# Patient Record
Sex: Female | Born: 1960 | Hispanic: Yes | Smoking: Never smoker
Health system: Southern US, Community
[De-identification: ages and names within clinical notes are randomized; demographics above are authoritative.]

## PROBLEM LIST (undated history)

## (undated) DIAGNOSIS — K760 Fatty (change of) liver, not elsewhere classified: Secondary | ICD-10-CM

## (undated) DIAGNOSIS — E559 Vitamin D deficiency, unspecified: Secondary | ICD-10-CM

## (undated) DIAGNOSIS — E669 Obesity, unspecified: Secondary | ICD-10-CM

## (undated) DIAGNOSIS — E785 Hyperlipidemia, unspecified: Secondary | ICD-10-CM

## (undated) DIAGNOSIS — E119 Type 2 diabetes mellitus without complications: Secondary | ICD-10-CM

## (undated) HISTORY — DX: Fatty (change of) liver, not elsewhere classified: K76.0

## (undated) HISTORY — DX: Vitamin D deficiency, unspecified: E55.9

## (undated) HISTORY — DX: Type 2 diabetes mellitus without complications: E11.9

## (undated) HISTORY — DX: Obesity, unspecified: E66.9

## (undated) HISTORY — DX: Hyperlipidemia, unspecified: E78.5

---

## 2010-11-19 HISTORY — PX: COLONOSCOPY: SHX174

## 2010-11-27 LAB — HM COLONOSCOPY

## 2014-11-19 DIAGNOSIS — K760 Fatty (change of) liver, not elsewhere classified: Secondary | ICD-10-CM

## 2014-11-19 HISTORY — DX: Fatty (change of) liver, not elsewhere classified: K76.0

## 2015-01-19 LAB — COMPLETE METABOLIC PANEL WITH GFR
ALK PHOS: 94 U/L
ALT: 45
AST: 33 U/L
Albumin: 4.6
BILIRUBIN, TOTAL: 0.7
Creat: 0.6
GLUCOSE: 95

## 2015-01-19 LAB — CBC
HEMOGLOBIN: 14.5 g/dL
PLATELET COUNT: 431
WBC: 5.1

## 2015-02-21 ENCOUNTER — Encounter: Payer: Self-pay | Admitting: Primary Care

## 2015-02-21 ENCOUNTER — Ambulatory Visit (INDEPENDENT_AMBULATORY_CARE_PROVIDER_SITE_OTHER): Payer: BLUE CROSS/BLUE SHIELD | Admitting: Primary Care

## 2015-02-21 VITALS — BP 126/86 | HR 72 | Temp 97.9°F | Ht 64.0 in | Wt 183.0 lb

## 2015-02-21 DIAGNOSIS — E785 Hyperlipidemia, unspecified: Secondary | ICD-10-CM | POA: Diagnosis not present

## 2015-02-21 DIAGNOSIS — E66811 Obesity, class 1: Secondary | ICD-10-CM

## 2015-02-21 DIAGNOSIS — I10 Essential (primary) hypertension: Secondary | ICD-10-CM | POA: Insufficient documentation

## 2015-02-21 DIAGNOSIS — I152 Hypertension secondary to endocrine disorders: Secondary | ICD-10-CM | POA: Insufficient documentation

## 2015-02-21 DIAGNOSIS — E1159 Type 2 diabetes mellitus with other circulatory complications: Secondary | ICD-10-CM | POA: Insufficient documentation

## 2015-02-21 DIAGNOSIS — R030 Elevated blood-pressure reading, without diagnosis of hypertension: Secondary | ICD-10-CM

## 2015-02-21 DIAGNOSIS — E669 Obesity, unspecified: Secondary | ICD-10-CM

## 2015-02-21 DIAGNOSIS — E1169 Type 2 diabetes mellitus with other specified complication: Secondary | ICD-10-CM | POA: Insufficient documentation

## 2015-02-21 HISTORY — DX: Obesity, unspecified: E66.9

## 2015-02-21 HISTORY — DX: Obesity, class 1: E66.811

## 2015-02-21 NOTE — Assessment & Plan Note (Signed)
Elevated in past and recently at Urgent Care 172/92. Currently on no medications. Today BP is stable. Will continue to monitor.  BP Readings from Last 3 Encounters:  02/21/15 126/86

## 2015-02-21 NOTE — Progress Notes (Signed)
Subjective:    Patient ID: Paige Caldwell, female    DOB: 1961/07/19, 54 y.o.   MRN: [id number]  HPI  Paige Caldwell is a 54 year old female who presents today to establish care and discuss the problems mentioned below. Will obtain old records.  1) Hyperlipidemia: Has been present for many years and was once on a statin medication in the past. The medication made her dizzy/sleepy so she is no longer taking it. She does not remember which statin in particular.   2) Weight gain: She's gained 20 lbs in the past 2 years. She follows a Hispanic diet consisting of beans, potatoes, tortillas, etc. Over the past two weeks she is trying to incorporate fruits and vegetables and overall improve her eating habits. She also started exercising 2 weeks ago and walks for about 45 minutes three times weekly. Drinks water mainly with occasional sodas on the weekends.  3) Elevated blood pressure: Elevated reading of 172/92 on 02/06/15 at [facility]. Denies chest pain, reports occasional headches. BP today stable.  BP Readings from Last 3 Encounters:  02/21/15 126/86    Review of Systems  Constitutional: Positive for unexpected weight change.  HENT: Negative for rhinorrhea.   Respiratory: Negative for cough and shortness of breath.   Cardiovascular: Positive for leg swelling. Negative for chest pain.  Gastrointestinal: Negative for diarrhea and constipation.  Genitourinary: Negative for dysuria and frequency.  Musculoskeletal: Positive for arthralgias.  Skin: Negative for rash.  Neurological: Positive for headaches. Negative for dizziness.  Hematological: Negative for adenopathy.  Psychiatric/Behavioral:       Felt depressed when living in [place], now feels better since moving to [place] 6 weeks.        Past Medical History  Diagnosis Date  . Hyperlipidemia     History   Social History  . Marital Status: Single    Spouse Name: N/A  . Number of Children: N/A  . Years of Education:  N/A   Occupational History  . Not on file.   Social History Main Topics  . Smoking status: Never Smoker   . Smokeless tobacco: Not on file  . Alcohol Use: No  . Drug Use: No  . Sexual Activity: Not on file   Other Topics Concern  . Not on file   Social History Narrative   Single.   Children, two boys, two girls.   Grandchildren   [place]   Works for [employer]   Enjoys playing on her computer    History reviewed. No pertinent past surgical history.  Family History  Problem Relation Age of Onset  . Hyperlipidemia Father     Deceased  . Diabetes Father   . Diabetes Sister   . Diabetes Brother   . Heart attack Father [id number]    No Known Allergies  No current outpatient prescriptions on file prior to visit.   No current facility-administered medications on file prior to visit.    BP 126/86 mmHg  Pulse 72  Temp(Src) 97.9 F (36.6 C) (Oral)  Ht 5\' 4"  (1.626 m)  Wt 183 lb (83.008 kg)  BMI 31.40 kg/m2  SpO2 98%    Objective:   Physical Exam  Constitutional: She is oriented to person, place, and time. She appears well-developed.  HENT:  Head: Normocephalic.  Right Ear: External ear normal.  Left Ear: External ear normal.  Nose: Nose normal.  Mouth/Throat: Oropharynx is clear and moist.  Eyes: Conjunctivae and EOM are normal. Pupils  are equal, round, and reactive to light.  Neck: Neck supple. No thyromegaly present.  Cardiovascular: Normal rate and regular rhythm.   Pulmonary/Chest: Effort normal and breath sounds normal.  Abdominal: Soft. Bowel sounds are normal. There is no tenderness.  Lymphadenopathy:    She has no cervical adenopathy.  Neurological: She is alert and oriented to person, place, and time. No cranial nerve deficit. Coordination normal.  Skin: Skin is warm and dry.  Psychiatric: She has a normal mood and affect.          Assessment & Plan:

## 2015-02-21 NOTE — Assessment & Plan Note (Signed)
Weight gain of 20 pounds over last 2 years. She is now starting to work on [employer] diet and exercise. Will continue to monitor.

## 2015-02-21 NOTE — Progress Notes (Signed)
Pre visit review using our clinic review tool, if applicable. No additional management support is needed unless otherwise documented below in the visit note. 

## 2015-02-21 NOTE — Assessment & Plan Note (Signed)
Reports prior elevation and was trailed on statin years ago. Will obtain old records. Encouraged healthy diet and exercise. She is currently working on improving her lifestyle.

## 2015-02-21 NOTE — Patient Instructions (Signed)
Please schedule a physical in one month and make a lab appointment one week prior to your physical. You will come to your lab appointment fasting (water and/or black coffee only).  Increase the fiber content in your diet and make sure to stay hydrated with water. You may also try taking daily Citrucel or Metamucil to help with constipation.   It was a pleasure meeting you! Welcome to [facility]!  Constipation Constipation is when a person has fewer than three bowel movements a week, has difficulty having a bowel movement, or has stools that are dry, hard, or larger than normal. As people grow older, constipation is more common. If you try to fix constipation with medicines that make you have a bowel movement (laxatives), the problem may get worse. Long-term laxative use may cause the muscles of the colon to become weak. A low-fiber diet, not taking in enough fluids, and taking certain medicines may make constipation worse.  CAUSES   Certain medicines, such as antidepressants, pain medicine, iron supplements, antacids, and water pills.   Certain diseases, such as diabetes, irritable bowel syndrome (IBS), thyroid disease, or depression.   Not drinking enough water.   Not eating enough fiber-rich foods.   Stress or travel.   Lack of physical activity or exercise.   Ignoring the urge to have a bowel movement.   Using laxatives too much.  SIGNS AND SYMPTOMS   Having fewer than three bowel movements a week.   Straining to have a bowel movement.   Having stools that are hard, dry, or larger than normal.   Feeling full or bloated.   Pain in the lower abdomen.   Not feeling relief after having a bowel movement.  DIAGNOSIS  Your health care provider will take a medical history and perform a physical exam. Further testing may be done for severe constipation. Some tests may include:  A barium enema X-ray to examine your rectum, colon, and, sometimes, your small intestine.    A sigmoidoscopy to examine your lower colon.   A colonoscopy to examine your entire colon. TREATMENT  Treatment will depend on the severity of your constipation and what is causing it. Some dietary treatments include drinking more fluids and eating more fiber-rich foods. Lifestyle treatments may include regular exercise. If these diet and lifestyle recommendations do not help, your health care provider may recommend taking over-the-counter laxative medicines to help you have bowel movements. Prescription medicines may be prescribed if over-the-counter medicines do not work.  HOME CARE INSTRUCTIONS   Eat foods that have a lot of fiber, such as fruits, vegetables, whole grains, and beans.  Limit foods high in fat and processed sugars, such as french fries, hamburgers, cookies, candies, and soda.   A fiber supplement may be added to your diet if you cannot get enough fiber from foods.   Drink enough fluids to keep your urine clear or pale yellow.   Exercise regularly or as directed by your health care provider.   Go to the restroom when you have the urge to go. Do not hold it.   Only take over-the-counter or prescription medicines as directed by your health care provider. Do not take other medicines for constipation without talking to your health care provider first.  SEEK IMMEDIATE MEDICAL CARE IF:   You have bright red blood in your stool.   Your constipation lasts for more than 4 days or gets worse.   You have abdominal or rectal pain.   You have thin, pencil-like stools.  You have unexplained weight loss. MAKE SURE YOU:   Understand these instructions.  Will watch your condition.  Will get help right away if you are not doing well or get worse. Document Released: 08/03/2004 Document Revised: 11/10/2013 Document Reviewed: 08/17/2013 [facility] Patient Information 2015 [place], [place]. This information is not intended to replace advice given to you by your health  care provider. Make sure you discuss any questions you have with your health care provider.

## 2015-03-15 ENCOUNTER — Other Ambulatory Visit: Payer: Self-pay | Admitting: Primary Care

## 2015-03-15 DIAGNOSIS — Z0000 Encounter for general adult medical examination without abnormal findings: Secondary | ICD-10-CM

## 2015-03-16 ENCOUNTER — Other Ambulatory Visit (INDEPENDENT_AMBULATORY_CARE_PROVIDER_SITE_OTHER): Payer: BLUE CROSS/BLUE SHIELD

## 2015-03-16 DIAGNOSIS — R7989 Other specified abnormal findings of blood chemistry: Secondary | ICD-10-CM | POA: Diagnosis not present

## 2015-03-16 DIAGNOSIS — Z0000 Encounter for general adult medical examination without abnormal findings: Secondary | ICD-10-CM | POA: Diagnosis not present

## 2015-03-16 LAB — COMPREHENSIVE METABOLIC PANEL
ALT: 76 U/L — ABNORMAL HIGH (ref 0–35)
AST: 27 U/L (ref 0–37)
Albumin: 4.1 g/dL (ref 3.5–5.2)
Alkaline Phosphatase: 74 U/L (ref 39–117)
BUN: 14 mg/dL (ref 6–23)
CO2: 29 meq/L (ref 19–32)
CREATININE: 1.17 mg/dL (ref 0.40–1.20)
Calcium: 9.3 mg/dL (ref 8.4–10.5)
Chloride: 104 mEq/L (ref 96–112)
GFR: 51.07 mL/min — ABNORMAL LOW (ref >60.00)
Glucose, Bld: 121 mg/dL — ABNORMAL HIGH (ref 70–99)
Potassium: 4.3 mEq/L (ref 3.5–5.1)
Sodium: 138 mEq/L (ref 135–145)
Total Bilirubin: 0.9 mg/dL (ref 0.2–1.2)
Total Protein: 7.2 g/dL (ref 6.0–8.3)

## 2015-03-16 LAB — CBC WITH DIFFERENTIAL/PLATELET
BASOS PCT: 0.6 % (ref 0.0–3.0)
Basophils Absolute: 0 10*3/uL (ref 0.0–0.1)
EOS ABS: 0.2 10*3/uL (ref 0.0–0.7)
EOS PCT: 2.9 % (ref 0.0–5.0)
HCT: 38.2 % (ref 36.0–46.0)
HEMOGLOBIN: 13.2 g/dL (ref 12.0–15.0)
LYMPHS PCT: 36.8 % (ref 12.0–46.0)
Lymphs Abs: 1.9 10*3/uL (ref 0.7–4.0)
MCHC: 34.5 g/dL (ref 30.0–36.0)
MCV: 86.9 fl (ref 78.0–100.0)
Monocytes Absolute: 0.5 10*3/uL (ref 0.1–1.0)
Monocytes Relative: 8.9 % (ref 3.0–12.0)
Neutro Abs: 2.7 10*3/uL (ref 1.4–7.7)
Neutrophils Relative %: 50.8 % (ref 43.0–77.0)
Platelets: 357 10*3/uL (ref 150.0–400.0)
RBC: 4.4 Mil/uL (ref 3.87–5.11)
RDW: 12.7 % (ref 11.5–15.5)
WBC: 5.3 10*3/uL (ref 4.0–10.5)

## 2015-03-16 LAB — LIPID PANEL
CHOL/HDL RATIO: 6
Cholesterol: 258 mg/dL — ABNORMAL HIGH (ref 0–200)
HDL: 45.5 mg/dL (ref >39.00)
NONHDL: 212.5
TRIGLYCERIDES: 344 mg/dL — AB (ref 0.0–149.0)
VLDL: 68.8 mg/dL — AB (ref 0.0–40.0)

## 2015-03-16 LAB — TSH: TSH: 0.76 u[IU]/mL (ref 0.35–4.50)

## 2015-03-16 LAB — HEMOGLOBIN A1C: Hgb A1c MFr Bld: 6.5 % (ref 4.6–6.5)

## 2015-03-16 LAB — LDL CHOLESTEROL, DIRECT: Direct LDL: 162 mg/dL

## 2015-03-23 ENCOUNTER — Encounter: Payer: Self-pay | Admitting: Primary Care

## 2015-03-23 ENCOUNTER — Ambulatory Visit (INDEPENDENT_AMBULATORY_CARE_PROVIDER_SITE_OTHER): Payer: BLUE CROSS/BLUE SHIELD | Admitting: Primary Care

## 2015-03-23 ENCOUNTER — Other Ambulatory Visit: Payer: Self-pay | Admitting: Primary Care

## 2015-03-23 VITALS — BP 124/84 | HR 70 | Temp 97.6°F | Ht 64.0 in | Wt 182.8 lb

## 2015-03-23 DIAGNOSIS — E785 Hyperlipidemia, unspecified: Secondary | ICD-10-CM

## 2015-03-23 DIAGNOSIS — K760 Fatty (change of) liver, not elsewhere classified: Secondary | ICD-10-CM

## 2015-03-23 DIAGNOSIS — E118 Type 2 diabetes mellitus with unspecified complications: Secondary | ICD-10-CM | POA: Insufficient documentation

## 2015-03-23 DIAGNOSIS — R7309 Other abnormal glucose: Secondary | ICD-10-CM

## 2015-03-23 DIAGNOSIS — R1011 Right upper quadrant pain: Secondary | ICD-10-CM | POA: Diagnosis not present

## 2015-03-23 DIAGNOSIS — R030 Elevated blood-pressure reading, without diagnosis of hypertension: Secondary | ICD-10-CM | POA: Diagnosis not present

## 2015-03-23 DIAGNOSIS — E559 Vitamin D deficiency, unspecified: Secondary | ICD-10-CM

## 2015-03-23 DIAGNOSIS — Z0000 Encounter for general adult medical examination without abnormal findings: Secondary | ICD-10-CM | POA: Diagnosis not present

## 2015-03-23 DIAGNOSIS — R7303 Prediabetes: Secondary | ICD-10-CM

## 2015-03-23 DIAGNOSIS — E669 Obesity, unspecified: Secondary | ICD-10-CM

## 2015-03-23 DIAGNOSIS — E119 Type 2 diabetes mellitus without complications: Secondary | ICD-10-CM

## 2015-03-23 DIAGNOSIS — E1165 Type 2 diabetes mellitus with hyperglycemia: Secondary | ICD-10-CM | POA: Insufficient documentation

## 2015-03-23 DIAGNOSIS — Z0001 Encounter for general adult medical examination with abnormal findings: Secondary | ICD-10-CM | POA: Insufficient documentation

## 2015-03-23 DIAGNOSIS — E1169 Type 2 diabetes mellitus with other specified complication: Secondary | ICD-10-CM | POA: Insufficient documentation

## 2015-03-23 HISTORY — DX: Type 2 diabetes mellitus without complications: E11.9

## 2015-03-23 HISTORY — DX: Fatty (change of) liver, not elsewhere classified: K76.0

## 2015-03-23 HISTORY — DX: Vitamin D deficiency, unspecified: E55.9

## 2015-03-23 LAB — MICROALBUMIN / CREATININE URINE RATIO
Creatinine,U: 121.3 mg/dL
MICROALB UR: 1.1 mg/dL (ref 0.0–1.9)
Microalb Creat Ratio: 0.9 mg/g (ref 0.0–30.0)

## 2015-03-23 LAB — VITAMIN D 25 HYDROXY (VIT D DEFICIENCY, FRACTURES): VITD: 19.25 ng/mL — ABNORMAL LOW (ref 30.00–100.00)

## 2015-03-23 MED ORDER — VITAMIN D (ERGOCALCIFEROL) 1.25 MG (50000 UNIT) PO CAPS
ORAL_CAPSULE | ORAL | Status: DC
Start: 2015-03-23 — End: 2016-04-23

## 2015-03-23 NOTE — Patient Instructions (Signed)
Complete lab work prior to leaving today. I will notify you of your results. Start taking Fish Oil daily. You may purchase this over the counter. Take at least 1000mg  daily. You will be contacted reagrding your mammogram. Stop by the front desk and speak with [facility] regarding your Ultrasound. Follow up in 3 months for diabetes re-check. Continue your efforts of a healthy diet and exercise. It is important that you improve your diet. Please limit carbohydrates in the form of white bread, rice, pasta, cakes, cookies, sugary drinks, etc. Increase your consumption of fresh fruits and vegetables. Be sure to drink plenty of water daily.

## 2015-03-23 NOTE — Progress Notes (Signed)
Pre visit review using our clinic review tool, if applicable. No additional management support is needed unless otherwise documented below in the visit note. 

## 2015-03-23 NOTE — Assessment & Plan Note (Signed)
Unremarkable exam mostly except RUQ pain. Discussed healthy diet and exercise. Tetanus, pap, and colonoscopy up to date. She is to make an eye and dental appointment. Mammogram ordered.

## 2015-03-23 NOTE — Assessment & Plan Note (Signed)
Stable today.  Will continue to monitor.

## 2015-03-23 NOTE — Assessment & Plan Note (Signed)
Right upper and right lateral trunk. ALT slightly elevated. Suspect this may be fatty liver, she does not consume alcohol. Abdominal ultrasound ordered. Do no suspect appendicitis or cholelithiasis at this point. Will continue to monitor.

## 2015-03-23 NOTE — Assessment & Plan Note (Signed)
A1c 6.5 She understands that she is at risk for diabetes and would like to work on diet and exercise as she has already begun to do.  Education provided to decrease carbohydrates in the form of bread, rice, pasta, tortillas, potatoes, sweets, etc. Recheck in 3 months, if elevated then will start Metformin.

## 2015-03-23 NOTE — Assessment & Plan Note (Signed)
Vitamin D of 19. Vitamin D 50,000 unit capsules sent to pharmacy. One weekly for 12 weeks. Will recheck at 12 weeks.

## 2015-03-23 NOTE — Assessment & Plan Note (Signed)
Weight loss of 1 pound since last visit. She is working hard to decrease carbohydrates and include more vegetables and lean meat. Will continue to monitor.

## 2015-03-23 NOTE — Assessment & Plan Note (Signed)
She is working very hard to improve diet and ensure daily exercise. 1 pound lost since last month.  We discussed the importance of diet and exercise and the impact it has on cholesterol. She would like to continue her efforts. Will re-check in 3-6 months, if still elevated, will consider statin.

## 2015-03-23 NOTE — Progress Notes (Signed)
Subjective:    Patient ID: Paige Caldwell, female    DOB: October 25, 1961, 54 y.o.   MRN: [id number]  HPI  Ms. Paige Caldwell is a 54 year old female who presents today for complete physical.  Immunizations: -Tetanus: 2013 -Influenza: Was not immunized last year. -Pneumonia: None  Diet: Overall improvement. She's been working hard to reduce carbohydrates and sweets. Breakfast: Fruit smoothies (makes at home), Lunch: Chicken and vegetables with decreased amount of tortillas. Dinner: Salads, vegetables, no meat, limited potatoes.  Wt Readings from Last 3 Encounters:  03/23/15 182 lb 12.8 oz (82.918 kg)  02/21/15 183 lb (83.008 kg)     Exercise: She has been walking 1-3 miles daily for the past month. Eye exam: She is planning on making an appointment. Dental exam: She is planning on making an appointment Colonoscopy: Last one was at age 54 at [facility], normal, follow up at 59. Pap Smear: Last 2014, due in 2017 Mammogram: Last one in 2014, due.  1) Abdominal pain: RUQ and lateral trunk. Intermittent for the past 6 months. She reports the pain is present during bowel movements. She went to her PCP in March who completed an xray which showed constipation. She has daily bowel movements and denies blood in the stools and pain upon defecation. Eating and drinking does affect her pain. ALT slightly elevated during blood work last week.   Review of Systems  Constitutional: Negative for fever, chills and unexpected weight change.  HENT: Negative for rhinorrhea.   Respiratory: Negative for cough and shortness of breath.   Cardiovascular: Negative for chest pain.       Some ankle edema  Gastrointestinal: Positive for abdominal pain. Negative for nausea, vomiting, diarrhea, constipation, blood in stool and abdominal distention.  Endocrine: Negative for polydipsia, polyphagia and polyuria.  Genitourinary: Negative for dysuria and frequency.  Musculoskeletal: Negative for myalgias and  arthralgias.  Skin: Negative for rash.  Neurological: Negative for dizziness and headaches.  Hematological: Negative for adenopathy.  Psychiatric/Behavioral:       Denies concerns for anxiety or depression.       Past Medical History  Diagnosis Date  . Hyperlipidemia     History   Social History  . Marital Status: Single    Spouse Name: N/A  . Number of Children: N/A  . Years of Education: N/A   Occupational History  . Not on file.   Social History Main Topics  . Smoking status: Never Smoker   . Smokeless tobacco: Not on file  . Alcohol Use: No  . Drug Use: No  . Sexual Activity: Not on file   Other Topics Concern  . Not on file   Social History Narrative   Single.   Children, two boys, two girls.   Grandchildren   [place] from [place].   Works for [employer]   Enjoys playing on her computer    No past surgical history on file.  Family History  Problem Relation Age of Onset  . Hyperlipidemia Father     Deceased  . Diabetes Father   . Diabetes Sister   . Diabetes Brother   . Heart attack Father [id number]    No Known Allergies  No current outpatient prescriptions on file prior to visit.   No current facility-administered medications on file prior to visit.    BP 124/84 mmHg  Pulse 70  Temp(Src) 97.6 F (36.4 C) (Oral)  Ht 5\' 4"  (1.626 m)  Wt 182 lb 12.8 oz (82.918 kg)  BMI  31.36 kg/m2  SpO2 98%    Objective:   Physical Exam  Constitutional: She is oriented to person, place, and time. She appears well-developed. She does not appear ill.  HENT:  Right Ear: Tympanic membrane and ear canal normal.  Left Ear: Tympanic membrane and ear canal normal.  Nose: Nose normal.  Mouth/Throat: Oropharynx is clear and moist.  Eyes: Conjunctivae and EOM are normal. Pupils are equal, round, and reactive to light.  Neck: Neck supple. No thyromegaly present.  Cardiovascular: Normal rate and regular rhythm.   Pulmonary/Chest: Effort normal and  breath sounds normal.  Abdominal: Soft. Bowel sounds are normal. She exhibits no mass. There is no splenomegaly or hepatomegaly. There is no tenderness. There is no guarding and negative Murphy's sign.  Musculoskeletal: Normal range of motion.  Lymphadenopathy:    She has no cervical adenopathy.  Neurological: She is alert and oriented to person, place, and time. She has normal reflexes.  Skin: Skin is warm and dry.  Psychiatric: She has a normal mood and affect.          Assessment & Plan:

## 2015-03-28 ENCOUNTER — Encounter: Payer: Self-pay | Admitting: *Deleted

## 2015-03-30 ENCOUNTER — Other Ambulatory Visit: Payer: BLUE CROSS/BLUE SHIELD

## 2015-04-13 ENCOUNTER — Ambulatory Visit
Admission: RE | Admit: 2015-04-13 | Discharge: 2015-04-13 | Disposition: A | Discharge status: Home / Self Care | Payer: BLUE CROSS/BLUE SHIELD | Source: Ambulatory Visit | Attending: Primary Care | Admitting: Primary Care

## 2015-04-13 ENCOUNTER — Ambulatory Visit: Payer: BLUE CROSS/BLUE SHIELD

## 2015-04-13 DIAGNOSIS — R1011 Right upper quadrant pain: Secondary | ICD-10-CM

## 2015-04-13 DIAGNOSIS — Z0000 Encounter for general adult medical examination without abnormal findings: Secondary | ICD-10-CM

## 2015-06-23 LAB — HM DIABETES EYE EXAM

## 2015-06-27 ENCOUNTER — Ambulatory Visit: Payer: BLUE CROSS/BLUE SHIELD | Admitting: Primary Care

## 2015-06-28 ENCOUNTER — Encounter: Payer: Self-pay | Admitting: Primary Care

## 2015-06-28 ENCOUNTER — Ambulatory Visit (INDEPENDENT_AMBULATORY_CARE_PROVIDER_SITE_OTHER): Payer: BLUE CROSS/BLUE SHIELD | Admitting: Primary Care

## 2015-06-28 VITALS — BP 116/86 | HR 71 | Temp 97.4°F | Ht 64.0 in | Wt 175.1 lb

## 2015-06-28 DIAGNOSIS — E669 Obesity, unspecified: Secondary | ICD-10-CM

## 2015-06-28 DIAGNOSIS — E785 Hyperlipidemia, unspecified: Secondary | ICD-10-CM

## 2015-06-28 DIAGNOSIS — R7309 Other abnormal glucose: Secondary | ICD-10-CM

## 2015-06-28 DIAGNOSIS — R7303 Prediabetes: Secondary | ICD-10-CM

## 2015-06-28 DIAGNOSIS — E559 Vitamin D deficiency, unspecified: Secondary | ICD-10-CM | POA: Diagnosis not present

## 2015-06-28 DIAGNOSIS — R030 Elevated blood-pressure reading, without diagnosis of hypertension: Secondary | ICD-10-CM | POA: Diagnosis not present

## 2015-06-28 NOTE — Assessment & Plan Note (Signed)
Weight loss of 7 pounds since last visit. Commended her on this accomplishment! Will check lipids today, if normal, will repeat in 6 months.

## 2015-06-28 NOTE — Progress Notes (Signed)
   Subjective:    Patient ID: Paige Caldwell, female    DOB: Dec 18, 1960, 54 y.o.   MRN: [id number]  HPI  Ms. Wayne Sever is a 54 year old female who presents today for follow up of Diabetes. She was noted to have an A1C of 6.5 and elevated cholesterol last visit during her complete physical. She wanted to work on her diet and continue exercising as she had already been doing before starting medications.   Since her last visit she's lost 7 pounds and is feeling much better.   Wt Readings from Last 3 Encounters:  06/28/15 175 lb 1.9 oz (79.434 kg)  03/23/15 182 lb 12.8 oz (82.918 kg)  02/21/15 183 lb (83.008 kg)   Diet consists of: Breakfast: Veggie smoothies and juicing. 1 egg. Lunch: Beans, small amount of beef or pork, vegetables (brocolli and carrots) Dinner: Peanut butter and apple, tortillas with lentils, vegetables Beverages: Water, no sodas for 3 months  She's limiting breads, rice, potatoes  Review of Systems  Respiratory: Negative for shortness of breath.   Cardiovascular: Negative for chest pain.  Neurological: Positive for numbness.       Past Medical History  Diagnosis Date  . Hyperlipidemia     History   Social History  . Marital Status: Single    Spouse Name: N/A  . Number of Children: N/A  . Years of Education: N/A   Occupational History  . Not on file.   Social History Main Topics  . Smoking status: Never Smoker   . Smokeless tobacco: Not on file  . Alcohol Use: No  . Drug Use: No  . Sexual Activity: Not on file   Other Topics Concern  . Not on file   Social History Narrative   Single.   Children, two boys, two girls.   Grandchildren   Moved here from [place].   Works for [employer]   Enjoys playing on her computer    No past surgical history on file.  Family History  Problem Relation Age of Onset  . Hyperlipidemia Father     Deceased  . Diabetes Father   . Diabetes Sister   . Diabetes Brother   . Heart attack Father 5     No Known Allergies  Current Outpatient Prescriptions on File Prior to Visit  Medication Sig Dispense Refill  . Vitamin D, Ergocalciferol, (DRISDOL) 50000 UNITS CAPS capsule Take one capsule by mouth once weekly for a total of 12 weeks. 12 capsule 0   No current facility-administered medications on file prior to visit.    BP 116/86 mmHg  Pulse 71  Temp(Src) 97.4 F (36.3 C) (Oral)  Ht  (1.626 m)  Wt 175 lb 1.9 oz (79.434 kg)  BMI 30.04 kg/m2  SpO2 98%    Objective:   Physical Exam  Constitutional: She appears well-nourished.  Cardiovascular: Normal rate and regular rhythm.   Pulmonary/Chest: Effort normal and breath sounds normal.  Skin: Skin is warm and dry.          Assessment & Plan:

## 2015-06-28 NOTE — Assessment & Plan Note (Signed)
Stable today.

## 2015-06-28 NOTE — Assessment & Plan Note (Signed)
Completed vitamin d. Will check levels today. If stable will have her on OTC maintanence

## 2015-06-28 NOTE — Assessment & Plan Note (Signed)
Weight loss of 7 pounds. Encouraged her to continue her loss through improvement of diet and exercise. Will continue to monitor.

## 2015-06-28 NOTE — Assessment & Plan Note (Addendum)
Weight loss of 7 pounds since last visit. Improvement in diet and exercise. A1C check today. Foot exam preformed today and was normal. Will have her follow up in 3 months.

## 2015-06-28 NOTE — Patient Instructions (Signed)
Complete lab work prior to leaving today. I will notify you of your results.  Continue your efforts towards a healthy diet and exercise. Congratulations on your weight loss of 7 pounds!  Follow up in 3 months for re-evaluation.  It was a pleasure to see you today!

## 2015-06-28 NOTE — Progress Notes (Signed)
Pre visit review using our clinic review tool, if applicable. No additional management support is needed unless otherwise documented below in the visit note. 

## 2015-06-29 LAB — COMPREHENSIVE METABOLIC PANEL
ALK PHOS: 71 U/L (ref 39–117)
ALT: 23 U/L (ref 0–35)
AST: 22 U/L (ref 0–37)
Albumin: 4.3 g/dL (ref 3.5–5.2)
BILIRUBIN TOTAL: 0.7 mg/dL (ref 0.2–1.2)
BUN: 12 mg/dL (ref 6–23)
CO2: 27 mEq/L (ref 19–32)
Calcium: 9.7 mg/dL (ref 8.4–10.5)
Chloride: 102 mEq/L (ref 96–112)
Creatinine, Ser: 0.65 mg/dL (ref 0.40–1.20)
GFR: 100.54 mL/min (ref >60.00)
GLUCOSE: 85 mg/dL (ref 70–99)
POTASSIUM: 4.3 meq/L (ref 3.5–5.1)
Sodium: 137 mEq/L (ref 135–145)
Total Protein: 8 g/dL (ref 6.0–8.3)

## 2015-06-29 LAB — LIPID PANEL
Cholesterol: 277 mg/dL — ABNORMAL HIGH (ref 0–200)
HDL: 49.5 mg/dL (ref >39.00)
NONHDL: 227.91
Total CHOL/HDL Ratio: 6
Triglycerides: 251 mg/dL — ABNORMAL HIGH (ref 0.0–149.0)
VLDL: 50.2 mg/dL — AB (ref 0.0–40.0)

## 2015-06-29 LAB — HEMOGLOBIN A1C: Hgb A1c MFr Bld: 6.3 % (ref 4.6–6.5)

## 2015-06-29 LAB — VITAMIN D 25 HYDROXY (VIT D DEFICIENCY, FRACTURES): VITD: 32.05 ng/mL (ref 30.00–100.00)

## 2015-06-29 LAB — LDL CHOLESTEROL, DIRECT: Direct LDL: 199 mg/dL

## 2015-07-08 ENCOUNTER — Telehealth: Payer: Self-pay

## 2015-07-08 NOTE — Telephone Encounter (Signed)
Pt walked in; 07/08/15 at 8:30 am. Pt walked by table and cut left upper leg slightly with pair of scissors co worker was holding,(scissors used to cut thread); area more like a very small puncture mark; no bleeding or sign of drainage or infection. Pt reported incident at work but pt is not sure the last time she had a tetanus shot.  Spoke with Almira Coaster RN team lead and she advised pt needs to ck with pts employer to see where pt should go for treatment; will be glad to schedule appt if approved by pts employer but since workers comp cannot do nurse visit tetanus injection. Pt voiced understanding and will contact employer.

## 2015-09-28 ENCOUNTER — Ambulatory Visit: Payer: BLUE CROSS/BLUE SHIELD | Admitting: Primary Care

## 2016-04-20 ENCOUNTER — Telehealth: Payer: Self-pay | Admitting: Primary Care

## 2016-04-20 NOTE — Telephone Encounter (Signed)
Pt aware.

## 2016-04-20 NOTE — Telephone Encounter (Signed)
Ok to do thanks. 

## 2016-04-20 NOTE — Telephone Encounter (Signed)
Absolutely. Very nice patient.

## 2016-04-20 NOTE — Telephone Encounter (Signed)
Pt came in today wanting to switch providers from [place] to dr G She speaks spanish and this would be easier for her Ok to schedule

## 2016-04-23 ENCOUNTER — Encounter: Payer: Self-pay | Admitting: Family Medicine

## 2016-04-23 ENCOUNTER — Ambulatory Visit (INDEPENDENT_AMBULATORY_CARE_PROVIDER_SITE_OTHER): Payer: BLUE CROSS/BLUE SHIELD | Admitting: Family Medicine

## 2016-04-23 VITALS — BP 120/82 | HR 74 | Temp 98.2°F | Wt 183.4 lb

## 2016-04-23 DIAGNOSIS — N644 Mastodynia: Secondary | ICD-10-CM

## 2016-04-23 DIAGNOSIS — R202 Paresthesia of skin: Secondary | ICD-10-CM | POA: Diagnosis not present

## 2016-04-23 NOTE — Assessment & Plan Note (Signed)
Exam WNL. Regardless given new symptom, check dx mammo and R US if needed at breast center. Possible neuropathy - rec start B12. Check levels at next fasting labs.

## 2016-04-23 NOTE — Progress Notes (Signed)
Pre visit review using our clinic review tool, if applicable. No additional management support is needed unless otherwise documented below in the visit note. 

## 2016-04-23 NOTE — Addendum Note (Signed)
Addended by: Josph MachoANCE, KIMBERLY A on: 04/23/2016 02:41 PM   Modules accepted: Orders

## 2016-04-23 NOTE — Progress Notes (Signed)
BP 120/82 mmHg  Pulse 74  Temp(Src) 98.2 F (36.8 C)  Wt 183 lb 6.4 oz (83.19 kg)  SpO2 97%   CC: L breast pain  Subjective:    Patient ID: Paige Caldwell, female    DOB: 06-30-1961, 55 y.o.   MRN: [id number]  HPI: Paige Caldwell is a 55 y.o. female presenting on 04/23/2016 for Breast Pain   Prior saw Dr Aida RaiderSarah Geanes at [facility] in [place].   26mo h/o burning type pain left lateral breast. Initially present then went away after 1 week. No skin changes. No lumps or masses palpated. No numbness. No itching. Has tried aleve which helps some.   Last screening mammogram 03/2015 WNL [place]. H/o 3D mammogram [place] 2015.   H/o HLD - prior on simvastatin which didn't help readings.   Preventative: Last CPE 2016. H/o colonoscopy 2010  Mammogram WNL 03/2015.  Relevant past medical, surgical, family and social history reviewed and updated as indicated. Interim medical history since our last visit reviewed. Allergies and medications reviewed and updated. No current outpatient prescriptions on file prior to visit.   No current facility-administered medications on file prior to visit.    Review of Systems Per HPI unless specifically indicated in ROS section     Objective:    BP 120/82 mmHg  Pulse 74  Temp(Src) 98.2 F (36.8 C)  Wt 183 lb 6.4 oz (83.19 kg)  SpO2 97%  Wt Readings from Last 3 Encounters:  04/23/16 183 lb 6.4 oz (83.19 kg)  06/28/15 175 lb 1.9 oz (79.434 kg)  03/23/15 182 lb 12.8 oz (82.918 kg)    Physical Exam  Constitutional: She appears well-developed and well-nourished. No distress.  HENT:  Mouth/Throat: Oropharynx is clear and moist. No oropharyngeal exudate.  Cardiovascular: Normal rate, regular rhythm, normal heart sounds and intact distal pulses.   No murmur heard. Pulmonary/Chest: Effort normal and breath sounds normal. No respiratory distress. She has no wheezes. She has no rales. She exhibits no tenderness. Right breast exhibits no inverted  nipple, no mass, no nipple discharge, no skin change and no tenderness. Left breast exhibits no inverted nipple, no mass, no nipple discharge, no skin change and no tenderness.  Musculoskeletal: She exhibits no edema.  Lymphadenopathy:       Head (right side): No submental, no submandibular, no tonsillar, no preauricular and no posterior auricular adenopathy present.       Head (left side): No submental, no submandibular, no tonsillar, no preauricular and no posterior auricular adenopathy present.    She has no axillary adenopathy.       Right axillary: No lateral adenopathy present.       Left axillary: No lateral adenopathy present.      Right: No supraclavicular adenopathy present.       Left: No supraclavicular adenopathy present.  Skin: Skin is warm and dry. No rash noted.  Psychiatric: She has a normal mood and affect.  Nursing note and vitals reviewed.  Results for orders placed or performed in visit on 07/04/15  HM DIABETES EYE EXAM  Result Value Ref Range   HM Diabetic Eye Exam  No Retinopathy      Assessment & Plan:   Problem List Items Addressed This Visit    Breast pain, right - Primary    Exam WNL. Regardless given new symptom, check dx mammo and R US if needed at breast center. Possible neuropathy - rec start B12. Check levels at next fasting labs.  Relevant Orders   MM Digital Diagnostic Bilat   US BREAST COMPLETE UNI RIGHT INC AXILLA   Paresthesia    Paresthesias of breast and feet along with endorsed fatigue. Check B12 level next visit. Suggested start b12 supplement.          Follow up plan: Return if symptoms worsen or fail to improve.  Eustaquio Boyden, MD

## 2016-04-23 NOTE — Patient Instructions (Addendum)
Firme forma para pedir records de colonoscopia y records de Dr Jerene BearsGeanes.  Pida por Paige BottomsMarion o Allison en nuestra oficina para hacer cita para mamograma diagnostico.  Regrese para examen fisico cuando pueda.  Gusto conocerla hoy, llamenos con preguntas.

## 2016-04-23 NOTE — Assessment & Plan Note (Signed)
Paresthesias of breast and feet along with endorsed fatigue. Check B12 level next visit. Suggested start b12 supplement.

## 2016-04-27 ENCOUNTER — Ambulatory Visit
Admission: RE | Admit: 2016-04-27 | Discharge: 2016-04-27 | Disposition: A | Discharge status: Home / Self Care | Payer: BLUE CROSS/BLUE SHIELD | Source: Ambulatory Visit | Attending: Family Medicine | Admitting: Family Medicine

## 2016-04-27 DIAGNOSIS — N644 Mastodynia: Secondary | ICD-10-CM

## 2016-04-27 LAB — HM MAMMOGRAPHY

## 2016-04-30 ENCOUNTER — Encounter: Payer: Self-pay | Admitting: *Deleted

## 2016-05-24 ENCOUNTER — Other Ambulatory Visit: Payer: Self-pay | Admitting: Family Medicine

## 2016-05-24 DIAGNOSIS — E559 Vitamin D deficiency, unspecified: Secondary | ICD-10-CM

## 2016-05-24 DIAGNOSIS — E785 Hyperlipidemia, unspecified: Secondary | ICD-10-CM

## 2016-05-24 DIAGNOSIS — R030 Elevated blood-pressure reading, without diagnosis of hypertension: Secondary | ICD-10-CM

## 2016-05-24 DIAGNOSIS — Z1159 Encounter for screening for other viral diseases: Secondary | ICD-10-CM

## 2016-05-24 DIAGNOSIS — R7303 Prediabetes: Secondary | ICD-10-CM

## 2016-05-24 DIAGNOSIS — E669 Obesity, unspecified: Secondary | ICD-10-CM

## 2016-05-25 ENCOUNTER — Other Ambulatory Visit (INDEPENDENT_AMBULATORY_CARE_PROVIDER_SITE_OTHER): Payer: BLUE CROSS/BLUE SHIELD

## 2016-05-25 DIAGNOSIS — E559 Vitamin D deficiency, unspecified: Secondary | ICD-10-CM | POA: Diagnosis not present

## 2016-05-25 DIAGNOSIS — Z1159 Encounter for screening for other viral diseases: Secondary | ICD-10-CM

## 2016-05-25 DIAGNOSIS — E785 Hyperlipidemia, unspecified: Secondary | ICD-10-CM | POA: Diagnosis not present

## 2016-05-25 DIAGNOSIS — R030 Elevated blood-pressure reading, without diagnosis of hypertension: Secondary | ICD-10-CM | POA: Diagnosis not present

## 2016-05-25 DIAGNOSIS — R7303 Prediabetes: Secondary | ICD-10-CM | POA: Diagnosis not present

## 2016-05-25 DIAGNOSIS — E669 Obesity, unspecified: Secondary | ICD-10-CM

## 2016-05-25 DIAGNOSIS — R7989 Other specified abnormal findings of blood chemistry: Secondary | ICD-10-CM | POA: Diagnosis not present

## 2016-05-25 LAB — BASIC METABOLIC PANEL
BUN: 13 mg/dL (ref 6–23)
CHLORIDE: 105 meq/L (ref 96–112)
CO2: 28 mEq/L (ref 19–32)
CREATININE: 0.71 mg/dL (ref 0.40–1.20)
Calcium: 9.6 mg/dL (ref 8.4–10.5)
GFR: 90.5 mL/min (ref >60.00)
GLUCOSE: 138 mg/dL — AB (ref 70–99)
POTASSIUM: 3.9 meq/L (ref 3.5–5.1)
Sodium: 140 mEq/L (ref 135–145)

## 2016-05-25 LAB — LIPID PANEL
CHOL/HDL RATIO: 6
CHOLESTEROL: 300 mg/dL — AB (ref 0–200)
HDL: 48.1 mg/dL (ref >39.00)
NONHDL: 252.31
TRIGLYCERIDES: 282 mg/dL — AB (ref 0.0–149.0)
VLDL: 56.4 mg/dL — ABNORMAL HIGH (ref 0.0–40.0)

## 2016-05-25 LAB — LDL CHOLESTEROL, DIRECT: Direct LDL: 220 mg/dL

## 2016-05-25 LAB — VITAMIN D 25 HYDROXY (VIT D DEFICIENCY, FRACTURES): VITD: 24.45 ng/mL — AB (ref 30.00–100.00)

## 2016-05-25 LAB — HEMOGLOBIN A1C: Hgb A1c MFr Bld: 6.7 % — ABNORMAL HIGH (ref 4.6–6.5)

## 2016-05-26 LAB — HEPATITIS C ANTIBODY: HCV AB: NEGATIVE

## 2016-05-27 ENCOUNTER — Encounter: Payer: Self-pay | Admitting: Family Medicine

## 2016-05-29 ENCOUNTER — Other Ambulatory Visit (HOSPITAL_COMMUNITY)
Admission: RE | Admit: 2016-05-29 | Discharge: 2016-05-29 | Disposition: A | Discharge status: Home / Self Care | Payer: BLUE CROSS/BLUE SHIELD | Source: Ambulatory Visit | Attending: Family Medicine | Admitting: Family Medicine

## 2016-05-29 ENCOUNTER — Encounter: Payer: Self-pay | Admitting: Family Medicine

## 2016-05-29 ENCOUNTER — Ambulatory Visit (INDEPENDENT_AMBULATORY_CARE_PROVIDER_SITE_OTHER): Payer: BLUE CROSS/BLUE SHIELD | Admitting: Family Medicine

## 2016-05-29 VITALS — BP 118/80 | HR 71 | Temp 98.2°F | Ht 63.0 in | Wt 180.0 lb

## 2016-05-29 DIAGNOSIS — Z01419 Encounter for gynecological examination (general) (routine) without abnormal findings: Secondary | ICD-10-CM

## 2016-05-29 DIAGNOSIS — Z0000 Encounter for general adult medical examination without abnormal findings: Secondary | ICD-10-CM

## 2016-05-29 DIAGNOSIS — E119 Type 2 diabetes mellitus without complications: Secondary | ICD-10-CM

## 2016-05-29 DIAGNOSIS — R202 Paresthesia of skin: Secondary | ICD-10-CM

## 2016-05-29 DIAGNOSIS — Z1151 Encounter for screening for human papillomavirus (HPV): Secondary | ICD-10-CM | POA: Diagnosis not present

## 2016-05-29 DIAGNOSIS — K760 Fatty (change of) liver, not elsewhere classified: Secondary | ICD-10-CM

## 2016-05-29 DIAGNOSIS — E785 Hyperlipidemia, unspecified: Secondary | ICD-10-CM

## 2016-05-29 DIAGNOSIS — E559 Vitamin D deficiency, unspecified: Secondary | ICD-10-CM

## 2016-05-29 DIAGNOSIS — E669 Obesity, unspecified: Secondary | ICD-10-CM

## 2016-05-29 MED ORDER — METFORMIN HCL 500 MG PO TABS: 500.0000 mg | ORAL_TABLET | Freq: Every day | ORAL | Status: DC

## 2016-05-29 NOTE — Assessment & Plan Note (Signed)
Reviewed A1c, discussed healthy diet changes to maintain glycemic control. Start metformin 500mg  daily. Return 3 mo f/u DM.

## 2016-05-29 NOTE — Progress Notes (Signed)
Pre visit review using our clinic review tool, if applicable. No additional management support is needed unless otherwise documented below in the visit note. 

## 2016-05-29 NOTE — Progress Notes (Signed)
BP 118/80 mmHg  Pulse 71  Temp(Src) 98.2 F (36.8 C) (Oral)  Ht  (1.6 m)  Wt 180 lb (81.647 kg)  BMI 31.89 kg/m2  SpO2 98%   CC: CPE  Subjective:    Patient ID: Paige Caldwell, female    DOB: Dec 07, 1960, 55 y.o.   MRN: [id number]  HPI: Paige Caldwell is a 55 y.o. female presenting on 05/29/2016 for Annual Exam   Increase in headaches recently, controlled with aleve. Minimal caffeine.  Controlled diabetic - discussed.  Uncontrolled HLD - discussed.   Preventative: COLONOSCOPY Date: 2010 wnl per patient, rpt 10 yrs ([place]).. We have not received records yet  Well woman WNL 2011 - rpt today Mammogram WNL 04/2016 Flu - declines Tdap 2013 Seat belt use discussed Sunscreen use discussed. No changing moles.   Single. Children, two boys, two girls. Grandchildren From [place]. Moved [place] from [place]. Works for [employer] Enjoys playing on her computer Activity:  Diet:  Relevant past medical, surgical, family and social history reviewed and updated as indicated. Interim medical history since our last visit reviewed. Allergies and medications reviewed and updated. No current outpatient prescriptions on file prior to visit.   No current facility-administered medications on file prior to visit.    Review of Systems  Constitutional: Negative for fever, chills, activity change, appetite change, fatigue and unexpected weight change.  HENT: Negative for hearing loss.   Eyes: Negative for visual disturbance.  Respiratory: Negative for cough, chest tightness, shortness of breath and wheezing.   Cardiovascular: Negative for chest pain, palpitations and leg swelling.  Gastrointestinal: Positive for abdominal pain and constipation. Negative for nausea, vomiting, diarrhea, blood in stool and abdominal distention.  Genitourinary: Negative for hematuria and difficulty urinating.  Musculoskeletal: Negative for myalgias, arthralgias and neck pain.  Skin: Negative for rash.    Neurological: Positive for headaches (more frequent). Negative for dizziness, seizures and syncope.  Hematological: Negative for adenopathy. Does not bruise/bleed easily.  Psychiatric/Behavioral: Positive for dysphoric mood. The patient is nervous/anxious.    Per HPI unless specifically indicated in ROS section     Objective:    BP 118/80 mmHg  Pulse 71  Temp(Src) 98.2 F (36.8 C) (Oral)  Ht  (1.6 m)  Wt 180 lb (81.647 kg)  BMI 31.89 kg/m2  SpO2 98%  Wt Readings from Last 3 Encounters:  05/29/16 180 lb (81.647 kg)  04/23/16 183 lb 6.4 oz (83.19 kg)  06/28/15 175 lb 1.9 oz (79.434 kg)    Physical Exam  Constitutional: She is oriented to person, place, and time. She appears well-developed and well-nourished. No distress.  HENT:  Head: Normocephalic and atraumatic.  Right Ear: Hearing, tympanic membrane, external ear and ear canal normal.  Left Ear: Hearing, external ear and ear canal normal.  Nose: Nose normal.  Mouth/Throat: Uvula is midline, oropharynx is clear and moist and mucous membranes are normal. No oropharyngeal exudate, posterior oropharyngeal edema or posterior oropharyngeal erythema.  Crescent scar inferior L TM  Eyes: Conjunctivae and EOM are normal. Pupils are equal, round, and reactive to light. No scleral icterus.  Neck: Normal range of motion. Neck supple.  Cardiovascular: Normal rate, regular rhythm, normal heart sounds and intact distal pulses.   No murmur heard. Pulses:      Radial pulses are 2+ on the right side, and 2+ on the left side.  Pulmonary/Chest: Effort normal and breath sounds normal. No respiratory distress. She has no wheezes. She has no rales.  Abdominal: Soft. Bowel sounds  are normal. She exhibits no distension and no mass. There is no tenderness. There is no rebound and no guarding.  Genitourinary: Vagina normal and uterus normal. Pelvic exam was performed with patient supine. There is no rash, tenderness or lesion on the right labia.  There is no rash, tenderness or lesion on the left labia. Cervix exhibits no motion tenderness, no discharge and no friability. Right adnexum displays no mass, no tenderness and no fullness. Left adnexum displays no mass, no tenderness and no fullness.  Pap performed on cervix - some erythema  Musculoskeletal: Normal range of motion. She exhibits no edema.  Lymphadenopathy:    She has no cervical adenopathy.  Neurological: She is alert and oriented to person, place, and time.  CN grossly intact, station and gait intact  Skin: Skin is warm and dry. No rash noted.  Psychiatric: She has a normal mood and affect. Her behavior is normal. Judgment and thought content normal.  Nursing note and vitals reviewed.  Results for orders placed or performed in visit on 05/25/16  Lipid panel  Result Value Ref Range   Cholesterol 300 (H) 0 - 200 mg/dL   Triglycerides 161.0 (H) 0.0 - 149.0 mg/dL   HDL 96.04 >54.09 mg/dL   VLDL 81.1 (H) 0.0 - 91.4 mg/dL   Total CHOL/HDL Ratio 6    NonHDL 252.31   Hemoglobin A1c  Result Value Ref Range   Hgb A1c MFr Bld 6.7 (H) 4.6 - 6.5 %  Basic metabolic panel  Result Value Ref Range   Sodium 140 135 - 145 mEq/L   Potassium 3.9 3.5 - 5.1 mEq/L   Chloride 105 96 - 112 mEq/L   CO2 28 19 - 32 mEq/L   Glucose, Bld 138 (H) 70 - 99 mg/dL   BUN 13 6 - 23 mg/dL   Creatinine, Ser 7.82 0.40 - 1.20 mg/dL   Calcium 9.6 8.4 - 95.6 mg/dL   GFR 21.30 >86.57 mL/min  VITAMIN D 25 Hydroxy (Vit-D Deficiency, Fractures)  Result Value Ref Range   VITD 24.45 (L) 30.00 - 100.00 ng/mL  Hepatitis C antibody  Result Value Ref Range   HCV Ab NEGATIVE NEGATIVE  LDL cholesterol, direct  Result Value Ref Range   Direct LDL 220.0 mg/dL      Assessment & Plan:   Problem List Items Addressed This Visit    Hyperlipidemia    Poor control. Reviewed chol levels, discussed goal levels in diabetic. Discussed healthy diet changes to reach goals.       Obesity, Class I, BMI 30-34.9     Continue to encourage weight loss.      Relevant Medications   metFORMIN (GLUCOPHAGE) 500 MG tablet   Health maintenance examination - Primary    Preventative protocols reviewed and updated unless pt declined. Discussed healthy diet and lifestyle.       Controlled diabetes mellitus type II without complication (HCC)    Reviewed A1c, discussed healthy diet changes to maintain glycemic control. Start metformin 500mg  daily. Return 3 mo f/u DM.       Relevant Medications   metFORMIN (GLUCOPHAGE) 500 MG tablet   Fatty liver disease, nonalcoholic    Recurring RUQ discomfort. Fatty liver suggested by [place] last year. No gallstone history. Continue to monitor. Discussed treatment of fatty liver.       Vitamin D deficiency    Level remains low - suggested restart 1000 IU daily      Paresthesia    Breast paresthesia has resolved.  Other Visit Diagnoses    Encounter for routine gynecological examination        Relevant Orders    Cytology - PAP [place]        Follow up plan: Return in about 3 months (around 08/29/2016) for follow up visit.  Eustaquio BoydenJavier Royce Stegman, MD

## 2016-05-29 NOTE — Assessment & Plan Note (Signed)
Continue to encourage weight loss. 

## 2016-05-29 NOTE — Assessment & Plan Note (Signed)
Recurring RUQ discomfort. Fatty liver suggested by US last year. No gallstone history. Continue to monitor. Discussed treatment of fatty liver.

## 2016-05-29 NOTE — Assessment & Plan Note (Signed)
Level remains low - suggested restart 1000 IU daily

## 2016-05-29 NOTE — Assessment & Plan Note (Signed)
Poor control. Reviewed chol levels, discussed goal levels in diabetic. Discussed healthy diet changes to reach goals.

## 2016-05-29 NOTE — Patient Instructions (Addendum)
Empieze metformina 598m con desayuno para controlar niveles de aChief of Staff cuidado con carbohidratos, azucar aadida a la dieta, bebidas dulces.  regresar en 3 meses para proxima visita.  Dejeme saber si dolor abdominal empeora  Mantenimiento de la salud - Mujeres (Health Maintenance, Female) Un estilo de vida saludable y los cuidados preventivos pueden favorecer considerablemente a la salud y eMusician Pregunte a su mdico cul es el cronograma de exmenes peridicos apropiado para usted. Esta es una buena oportunidad para consultarlo sobre cmo prevenir enfermedades y mVicksburgsano. Adems de los controles, hay muchas otras cosas que puede hacer usted mismo. Los expertos han realizado numerosas investigaciones sArvinMeritorcambios en el estilo de vida y las medidas de prevencin que, mDavidson lo ayudarn a mantenerse sano. Solicite a su mdico ms informacin. EL PESO Y LA DIETA  Consuma una dieta saludable.  Asegrese de iFamily Dollar Storesverduras, frutas, productos lcteos de bajo contenido de gDjiboutiy pAdvertising account planner  No consuma muchos alimentos de alto contenido de grasas slidas, azcares agregados o sal.  Realice actividad fsica con regularidad. Esta es una de las prcticas ms importantes que puede hacer por su salud.  La mayora de los adultos deben hacer ejercicio durante al menos 1539mutos por semana. El ejercicio debe aumentar la frecuencia cardaca y prActora transpiracin (ejercicio de inMontgomery  La mayora de los adultos tambin deben haField seismologistjercicios de elongacin al meToysRuseces a la semana. Agregue esto al su plan de ejercicio de intensidad moderada. Mantenga un peso saludable.  El ndice de masa corporal (IOur Lady Of The Lake Regional Medical Centeres una medida que puede utilizarse para identificar posibles problemas de peNaytahwaushProporciona una estimacin de la grasa corporal basndose en el peso y la altura. Su mdico puede ayudarle a deRadiation protection practitionerMSummit a loScientist, forensic maTheatre managern peso  saludable.  Para las mujeres de 20aos o ms:  Un IMSt. Alexius Hospital - Jefferson Campusenor de 18,5 se considera bajo peso.  Un IMVenture Ambulatory Surgery Center LLCntre 18,5 y 24,9 es normal.  Un IMAdventist Medical Center Hanfordntre 25 y 29,9 se considera sobrepeso.  Un IMC de 30 o ms se considera obesidad. Observe los niveles de colesterol y lpidos en la sangre.  Debe comenzar a reEnglish as a second language teachere lpidos y coResearch officer, trade unionn la sangre a los 20aos y luego repetirlos cada 5a22aos Es posible que neAutomotive engineeros niveles de colesterol con mayor frecuencia si:  Sus niveles de lpidos y colesterol son altos.  Es mayor de 50aos.  Presenta un alto riesgo de padecer enfermedades cardacas. DETECCIN DE CNCER  Cncer de pulmn  Se recomienda realizar exmenes de deteccin de cncer de pulmn a personas adultas entre [id number] que estn en riesgo de deHorticulturist, commerciale pulmn por sus antecedentes de consumo de tabaco.  Se recomienda una tomografa computarizada de baja dosis de los puLiberty Mediaos a las personas que:  Fuman actualmente.  Hayan dejado el hbito en algn momento en los ltimos 15aos.  Hayan fumado durante 30aos un paquete diario. Un paquete-ao equivale a fumar un promedio de un paquete de cigarrillos diario durante un ao.  Los exmenes de deteccin anuales deben continuar hasta que hayan pasado 15aos desde que dej de fumar.  Ya no debern realizarse si tiene un problema de salud que le impida recibir tratamiento para elScience writere pulmn. Cncer de mama  Practique la autoconciencia de la mama. Esto significa reconocer la apariencia normal de sus mamas y cmo las siente.  Tambin significa realizar autoexmenes regulares de laJohnson & JohnsonInforme a  su mdico sobre cualquier cambio, sin importar cun pequeo sea.  Si tiene entre 20 y 74 aos, un mdico debe realizarle un examen clnico de las mamas como parte del examen regular de [place], cada 1 a 3aos.  Si tiene 40aos o ms, debe Information systems manager clnico de las [employer]. Tambin considere realizarse una [place]) todos los [place].  Si tiene antecedentes familiares de cncer de mama, hable con su mdico para someterse a un estudio gentico.  Si tiene alto riesgo de Chief Financial Officer de mama, hable con su mdico para someterse a Public house manager y [employer].  La evaluacin del gen del cncer de mama (BRCA) se recomienda a mujeres que tengan familiares con cnceres relacionados con el BRCA. Los cnceres relacionados con el BRCA incluyen los siguientes:  Mama.  Ovario.  Trompas.  Cnceres de peritoneo.  Los resultados de la evaluacin determinarn la necesidad de asesoramiento gentico y de [place] de BRCA1 y BRCA2. Cncer de cuello del tero El mdico puede recomendarle que se haga pruebas peridicas de deteccin de cncer de los rganos de la pelvis (ovarios, tero y vagina). Estas pruebas incluyen un examen plvico, que abarca controlar si se produjeron cambios microscpicos en la superficie del cuello del tero (prueba de Papanicolaou). Pueden recomendarle que se haga estas pruebas cada 3aos, a partir de los 21aos.  A las mujeres que tienen entre 30 y 64aos, los mdicos pueden recomendarles que se sometan a exmenes plvicos y pruebas de Papanicolaou cada 55aos, o a la prueba de Papanicolaou y el examen plvico en combinacin con estudios de deteccin del virus del papiloma humano (VPH) cada 5aos. Algunos tipos de VPH aumentan el riesgo de Chief Financial Officer de cuello del tero. La prueba para la deteccin del VPH tambin puede realizarse a mujeres de cualquier edad cuyos resultados de la prueba de Papanicolaou no sean claros.  Es posible que otros mdicos no recomienden exmenes de deteccin a mujeres no embarazadas que se consideran sujetos de bajo riesgo de Chief Financial Officer de pelvis y que no tienen sntomas. Pregntele al mdico si un examen plvico de deteccin es adecuado para  usted.  Si ha recibido un tratamiento para Science writer cervical o una enfermedad que podra causar cncer, necesitar realizarse una prueba de Papanicolaou y controles durante al menos 25 aos de concluido el [place]. Si no se ha hecho el Papanicolaou con regularidad, debern volver a evaluarse los factores de riesgo (como tener un nuevo compaero sexual), para Teacher, adult education si debe realizarse los estudios nuevamente. Algunas mujeres sufren problemas mdicos que aumentan la probabilidad de Museum/gallery curator cncer de cuello del tero. En estos casos, el mdico podr QUALCOMM se realicen controles y pruebas de Papanicolaou con ms frecuencia. Cncer colorrectal  Este tipo de cncer puede detectarse y a menudo prevenirse.  Por lo general, los estudios de rutina se deben Medical laboratory scientific officer a Field seismologist a Proofreader de los 45 aos y Hidalgo 12 aos.  Sin embargo, el mdico podr aconsejarle que lo haga antes, si tiene factores de riesgo para el cncer de colon.  Tambin puede recomendarle que use un kit de prueba para Hydrologist en la materia fecal.  Es posible que se use una pequea cmara en el extremo de un tubo para examinar directamente el colon (sigmoidoscopia o colonoscopia) a fin de Hydrographic surveyor formas tempranas de cncer colorrectal.  Los exmenes de rutina generalmente comienzan a los 56aos.  El examen directo del colon se  debe repetir cada 5 a 10aos hasta los 75aos. Sin embargo, es posible que se realicen exmenes con mayor frecuencia, si se detectan formas tempranas de plipos precancerosos o pequeos bultos. Cncer de piel  Revise la piel de la cabeza a los pies con regularidad.  Informe a su mdico si aparecen nuevos lunares o los que tiene se modifican, especialmente en su forma y color.  Tambin notifique al mdico si tiene un lunar que es ms grande que el tamao de una goma de lpiz.  Siempre use pantalla solar. Aplique pantalla solar de Kerry Dory y repetida a lo largo del  Training and development officer.  Protjase usando mangas y [employer], un sombrero de ala ancha y gafas para el sol, siempre que se encuentre en el exterior. ENFERMEDADES CARDACAS, DIABETES E HIPERTENSIN ARTERIAL   La hipertensin arterial causa enfermedades cardacas y [place] el riesgo de ictus. La hipertensin arterial es ms probable en los siguientes casos:  Las personas que tienen la presin arterial en el extremo del rango normal (100-139/85-89 mm Hg).  Las personas con sobrepeso u obesidad.  Las Retail banker.  Si usted tiene entre 18 y 39 aos, debe medirse la presin arterial cada 3 a 5 aos. Si usted tiene 40 aos o ms, debe medirse la presin arterial [employer]lett[employer]ard. Debe medirse la presin arterial dos veces: una vez cuando est en un hospital o una clnica y la otra vez cuando est en otro sitio. Registre el promedio de [employer] Stores. Para controlar su presin arterial cuando no est en un hospital o [place], puede usar lo siguiente:  Ardelia Mems mquina automtica para medir la presin arterial en una farmacia.  Un monitor para medir la presin arterial en el hogar.  Si tiene entre 62 y 74 aos, consulte a su mdico si debe tomar aspirina para prevenir el ictus.  Realcese exmenes de deteccin de la diabetes con regularidad. Esto incluye la toma de [place] de sangre para controlar el nivel de azcar en la sangre durante el [place].  Si tiene un peso normal y un bajo riesgo de padecer diabetes, realcese este anlisis cada tres aos despus de los 45aos.  Si tiene sobrepeso y un alto riesgo de padecer diabetes, considere someterse a este anlisis antes o con mayor frecuencia. PREVENCIN DE INFECCIONES  HepatitisB  Si tiene un riesgo ms alto de Museum/gallery curator hepatitis B, debe someterse a un examen de deteccin de este virus. Se considera que tiene un alto riesgo de Museum/gallery curator hepatitis B si:  Naci en un pas donde la hepatitis B es frecuente. Pregntele a su mdico qu pases  son considerados de Public affairs consultant.  Sus padres nacieron en un pas de alto riesgo y usted no recibi una vacuna que lo proteja contra la hepatitis B (vacuna contra la hepatitis B).  [place].  [place] agujas para inyectarse drogas.  Vive con alguien que tiene hepatitis B.  Ha tenido sexo con alguien que tiene hepatitis B.  Recibe tratamiento de hemodilisis.  Toma ciertos medicamentos para el cncer, trasplante de rganos y afecciones autoinmunitarias. Hepatitis C  Se recomienda un anlisis de [place] para:  Todos los que nacieron entre 1945 y [phone].  Todas las personas que tengan un riesgo de haber contrado hepatitis C. Enfermedades de transmisin sexual (ETS).  Debe realizarse pruebas de deteccin de enfermedades de transmisin sexual (ETS), incluidas gonorrea y clamidia si:  Es sexualmente activo y es menor de 24aos.  Es mayor de 24aos, y Investment banker, operational  informa que corre riesgo de [employer] tipo de infecciones.  La actividad sexual ha cambiado desde que le hicieron la ltima prueba de deteccin y tiene un riesgo mayor de Best boy clamidia o Radio broadcast assistant. Pregntele al mdico si usted tiene riesgo.  Si no tiene el VIH, pero corre riesgo de infectarse por el virus, se recomienda tomar diariamente un medicamento recetado para evitar la infeccin. Esto se conoce como profilaxis previa a la exposicin. Se considera que est en riesgo si:  Es [place] sexualmente y no [place] preservativos habitualmente o no conoce el estado del VIH de sus Advertising copywriter.  Se inyecta drogas.  Es [place] sexualmente con Ardelia Mems pareja que tiene VIH. Consulte a su mdico para saber si tiene un alto riesgo de infectarse por el VIH. Si opta por comenzar la profilaxis previa a la exposicin, primero debe realizarse anlisis de deteccin del VIH. Luego, le harn anlisis cada 21mses mientras est tomando los medicamentos para la profilaxis previa a la exposicin.  [facility]  Si es premenopusica y puede quedar  eOlds solicite a su mdico asesoramiento previo a la concepcin.  Si puede quedar embarazada, tome 400 a [id number](mcg) de cido fAnheuser-Busch  Si desea evitar el embarazo, hable con su mdico sobre el control de la natalidad (anticoncepcin). OSTEOPOROSIS Y MENOPAUSIA   La osteoporosis es una enfermedad en la que los huesos pierden los minerales y la fuerza por el avance de la edad. El resultado pueden ser fracturas graves en los hShady Hills El riesgo de osteoporosis puede identificarse con uArdelia Memsprueba de densidad sea.  Si tiene 65aos o ms, o si est en riesgo de sufrir osteoporosis y fracturas, pregunte a su mdico si debe someterse a exmenes.  Consulte a su mdico si debe tomar un suplemento de calcio o de vitamina D para reducir el riesgo de osteoporosis.  La menopausia puede presentar ciertos sntomas fsicos y rGaffer  La terapia de reemplazo hormonal puede reducir algunos de estos sntomas y rGaffer Consulte a su mdico para saber si la terapia de reemplazo hormonal es conveniente para usted.  INSTRUCCIONES PARA EL CUIDADO EN EL HOGAR   Realcese los estudios de rutina de la salud, dentales y de lPublic librarian  MOlivet  No consuma ningn producto que contenga tabaco, lo que incluye cigarrillos, tabaco de mHigher education careers advisero cPsychologist, sport and exercise  Si est embarazada, no beba alcohol.  Si est amamantando, reduzca el consumo de alcohol y la frecuencia con la que consume.  Si es mujer y no est embarazada limite el consumo de alcohol a no ms de 1 medida por da. Una medida equivale a 12onzas de cerveza, 5onzas de vino o 1onzas de bebidas alcohlicas de alta graduacin.  No consuma drogas.  No comparta agujas.  Solicite ayuda a su mdico si necesita apoyo o informacin para abandonar las drogas.  Informe a su mdico si a menudo se siente deprimido.  Notifique a su mdico si alguna vez ha sido vctima de abuso o si no se siente  seguro en su hogar.   Esta informacin no tiene cMarine scientistel consejo del mdico. Asegrese de hacerle al mdico cualquier pregunta que tenga.   Document Released: 10/25/2011 Document Revised: 11/26/2014 Elsevier Interactive Patient Education 2[employer]

## 2016-05-29 NOTE — Assessment & Plan Note (Signed)
Preventative protocols reviewed and updated unless pt declined. Discussed healthy diet and lifestyle.  

## 2016-05-29 NOTE — Assessment & Plan Note (Signed)
Breast paresthesia has resolved.

## 2016-05-31 LAB — CYTOLOGY - PAP

## 2016-07-11 ENCOUNTER — Encounter: Payer: Self-pay | Admitting: *Deleted

## 2016-08-31 ENCOUNTER — Encounter: Payer: Self-pay | Admitting: Family Medicine

## 2016-08-31 ENCOUNTER — Ambulatory Visit (INDEPENDENT_AMBULATORY_CARE_PROVIDER_SITE_OTHER): Payer: BLUE CROSS/BLUE SHIELD | Admitting: Family Medicine

## 2016-08-31 VITALS — BP 136/84 | HR 72 | Temp 97.9°F | Wt 179.2 lb

## 2016-08-31 DIAGNOSIS — R202 Paresthesia of skin: Secondary | ICD-10-CM | POA: Diagnosis not present

## 2016-08-31 DIAGNOSIS — E669 Obesity, unspecified: Secondary | ICD-10-CM

## 2016-08-31 DIAGNOSIS — E119 Type 2 diabetes mellitus without complications: Secondary | ICD-10-CM | POA: Diagnosis not present

## 2016-08-31 DIAGNOSIS — E782 Mixed hyperlipidemia: Secondary | ICD-10-CM

## 2016-08-31 MED ORDER — METFORMIN HCL 500 MG PO TABS: 500.0000 mg | ORAL_TABLET | Freq: Every day | ORAL | 3 refills | Status: DC

## 2016-08-31 NOTE — Patient Instructions (Addendum)
La referiremos a clases para la diabetes en [place]. Mandeme resumen de examen de ojos.  Regresar cuando pueda para examenes de [employer].  Regresar en 5 meses para proxima visita.   La diabetes mellitus y los alimentos (Diabetes Mellitus and Food) Es importante que controle su nivel de azcar en la sangre (glucosa). El nivel de glucosa en sangre depende en gran medida de lo que usted come. [place]er alimentos saludables en las cantidades [place] a lo largo del Futures trader, aproximadamente a la misma hora [employer], lo ayudar a Chief Operating Officer su nivel de Event organiser. Tambin puede ayudarlo a retrasar o Fish farm manager de la diabetes mellitus. Comer de Regions Financial Corporation saludable incluso puede ayudarlo a Event organiser de presin arterial y a Barista o Pharmacologist un peso saludable.  Entre las recomendaciones generales para alimentarse y Water quality scientist los alimentos de forma saludable, se incluyen las siguientes:  Respetar las comidas principales y comer colaciones con regularidad. Evitar pasar largos perodos sin comer con el fin de perder peso.  Seguir una dieta que consista principalmente en alimentos de origen vegetal, como frutas, vegetales, frutos secos, legumbres y cereales integrales.  Utilizar mtodos de coccin a baja temperatura, como hornear, en lugar de mtodos de coccin a alta temperatura, como frer en abundante aceite. Trabaje con el nutricionista para aprender a Acupuncturist nutricional de las etiquetas de los alimentos. CMO PUEDEN AFECTARME LOS ALIMENTOS? Carbohidratos Los carbohidratos afectan el nivel de glucosa en sangre ms que cualquier otro tipo de alimento. El nutricionista lo ayudar a Chief Strategy Officer cuntos carbohidratos puede consumir en cada comida y ensearle a contarlos. El recuento de carbohidratos es importante para mantener la glucosa en sangre en un nivel saludable, en especial si utiliza insulina o toma determinados medicamentos para la diabetes  mellitus. Alcohol El alcohol puede provocar disminuciones sbitas de la glucosa en sangre (hipoglucemia), en especial si utiliza insulina o toma determinados medicamentos para la diabetes mellitus. La hipoglucemia es una afeccin que puede poner en peligro la vida. Los sntomas de la hipoglucemia (somnolencia, mareos y Administrator) son similares a los sntomas de haber consumido mucho alcohol.  Si el mdico lo autoriza a beber alcohol, hgalo con moderacin y siga estas pautas:  Las mujeres no deben beber ms de un trago por da, y los hombres no deben beber ms de dos tragos por Futures trader. Un trago es igual a:  12 onzas (355 ml) de cerveza  5 onzas de vino (150 ml) de vino  1,5onzas (45ml) de bebidas espirituosas  No beba con el estmago vaco.  Mantngase hidratado. Beba agua, gaseosas dietticas o t helado sin azcar.  Las gaseosas comunes, los jugos y otros refrescos podran contener muchos carbohidratos y se Heritage manager. QU ALIMENTOS NO SE RECOMIENDAN? Cuando haga las elecciones de alimentos, es importante que recuerde que todos los alimentos son distintos. Algunos tienen menos nutrientes que otros por porcin, aunque podran tener la misma cantidad de caloras o carbohidratos. Es difcil darle al cuerpo lo que necesita cuando consume alimentos con menos nutrientes. Estos son algunos ejemplos de alimentos que debera evitar ya que contienen muchas caloras y carbohidratos, pero pocos nutrientes:  Neurosurgeon trans (la mayora de los alimentos procesados incluyen grasas trans en la etiqueta de Informacin nutricional).  Gaseosas comunes.  Jugos.  Caramelos.  Dulces, como tortas, pasteles, rosquillas y [place].  Comidas fritas. QU ALIMENTOS PUEDO COMER? Consuma alimentos ricos en nutrientes, que nutrirn el cuerpo y lo mantendrn saludable. Los alimentos que debe comer tambin  dependern de varios factores, como:  Las caloras que necesita.  Los medicamentos que toma.  Su  peso.  El nivel de glucosa en Tiburonsangre.  El Varnvillenivel de presin arterial.  El nivel de colesterol. Debe consumir una amplia variedad de alimentos, por ejemplo:  Protenas.  Cortes de Target Corporationcarne magros.  Protenas con bajo contenido de grasas saturadas, como pescado, clara de huevo y frijoles. Evite las carnes procesadas.  Frutas y vegetales.  Frutas y Sports administratorvegetales que pueden ayudar a Chief Operating Officercontrolar los niveles sanguneos de Klondikeglucosa, como Huntingtonmanzanas, mangos y batatas.  Productos lcteos.  Elija productos lcteos sin grasa o con bajo contenido de Potter Valleygrasa, como Plumervilleleche, yogur y Timberlanequeso.  Cereales, panes, pastas y arroz.  Elija cereales integrales, como panes multicereales, avena en grano y arroz integral. Estos alimentos pueden ayudar a controlar la presin arterial.  Rosalin HawkingGrasas.  Alimentos que contengan grasas saludables, como frutos secos, Chartered certified accountantaguacate, aceite de Sheltonoliva, aceite de canola y pescado. TODOS LOS QUE PADECEN DIABETES MELLITUS TIENEN EL MISMO PLAN DE COMIDAS? Dado que todas las personas que padecen diabetes mellitus son distintas, no hay un solo plan de comidas que funcione para todos. Es muy importante que se rena con un nutricionista que lo ayudar a crear un plan de comidas adecuado para usted.   Esta informacin no tiene Theme park managercomo fin reemplazar el consejo del mdico. Asegrese de hacerle al mdico cualquier pregunta que tenga.   Document Released: 02/12/2008 Document Revised: 11/26/2014 Elsevier Interactive Patient Education [employer]2016 Elsevier Inc.

## 2016-08-31 NOTE — Progress Notes (Signed)
Pre visit review using our clinic review tool, if applicable. No additional management support is needed unless otherwise documented below in the visit note. 

## 2016-08-31 NOTE — Progress Notes (Signed)
BP 136/84   Pulse 72   Temp 97.9 F (36.6 C) (Oral)   Wt 179 lb 4 oz (81.3 kg)   BMI 31.75 kg/m    CC: 69mo f/u visit Subjective:    Patient ID: Paige Caldwell, female    DOB: [birth year], 55 y.o.   MRN: [id number]  HPI: Paige Caldwell is a 55 y.o. female presenting on 08/31/2016 for Follow-up   DM - regularly does not check sugars. Compliant with antihyperglycemic regimen which includes: metformin 500mg  daily. Denies hypoglycemic symptoms. Denies paresthesias. Interested in diabetes education. Last diabetic eye exam scheduled next week. Pneumovax: declines. Prevnar: not due yet. Lab Results  Component Value Date   HGBA1C 6.7 (H) 05/25/2016   Diabetic Foot Exam - Simple   Simple Foot Form Diabetic Foot exam was performed with the following findings:  Yes 08/31/2016  4:13 PM  Visual Inspection No deformities, no ulcerations, no other skin breakdown bilaterally:  Yes Sensation Testing Intact to touch and monofilament testing bilaterally:  Yes Pulse Check Posterior Tibialis and Dorsalis pulse intact bilaterally:  Yes Comments     Relevant past medical, surgical, family and social history reviewed and updated as indicated. Interim medical history since our last visit reviewed. Allergies and medications reviewed and updated. No current outpatient prescriptions on file prior to visit.   No current facility-administered medications on file prior to visit.     Review of Systems Per HPI unless specifically indicated in ROS section     Objective:    BP 136/84   Pulse 72   Temp 97.9 F (36.6 C) (Oral)   Wt 179 lb 4 oz (81.3 kg)   BMI 31.75 kg/m   Wt Readings from Last 3 Encounters:  08/31/16 179 lb 4 oz (81.3 kg)  05/29/16 180 lb (81.6 kg)  04/23/16 183 lb 6.4 oz (83.2 kg)    Physical Exam  Constitutional: She appears well-developed and well-nourished. No distress.  HENT:  Head: Normocephalic and atraumatic.  Right Ear: External ear normal.  Left Ear: External ear  normal.  Nose: Nose normal.  Mouth/Throat: Oropharynx is clear and moist. No oropharyngeal exudate.  Eyes: Conjunctivae and EOM are normal. Pupils are equal, round, and reactive to light. No scleral icterus.  Neck: Normal range of motion. Neck supple.  Cardiovascular: Normal rate, regular rhythm, normal heart sounds and intact distal pulses.   No murmur heard. Pulmonary/Chest: Effort normal and breath sounds normal. No respiratory distress. She has no wheezes. She has no rales.  Musculoskeletal: She exhibits no edema.  See HPI for foot exam if done  Lymphadenopathy:    She has no cervical adenopathy.  Skin: Skin is warm and dry. No rash noted.  Psychiatric: She has a normal mood and affect.  Nursing note and vitals reviewed.  Results for orders placed or performed in visit on 07/11/16  CBC  Result Value Ref Range   WBC 5.1    HGB 14.5 g/dL   platelet count [id number]   COMPLETE METABOLIC PANEL WITH GFR  Result Value Ref Range   Glucose 95    Creat 0.60    Albumin 4.6    Alkaline Phosphatase 94 U/L   ALT 45    AST 33 U/L   Bilirubin, Total 0.7       Assessment & Plan:   Problem List Items Addressed This Visit    Controlled diabetes mellitus type II without complication (HCC) - Primary    Chronic, stable. Tolerating metformin 500mg  once daily, continue. Refer  to diabetes education. Foot exam today. Upcoming eye exam. Provided with DM and food handout. RTC 5 mo f/u visit. Pt agrees with plan.      Relevant Medications   metFORMIN (GLUCOPHAGE) 500 MG tablet   Other Relevant Orders   Ambulatory referral to diabetic education   Hemoglobin A1c   Basic metabolic panel   Hyperlipidemia    Update with better sugar control, will consider statin if remaining elevated.       Relevant Orders   Lipid panel   Obesity, Class I, BMI 30-34.9    Reviewed recent healthy diet changes, encouraged continued attempts to lose weight.       Relevant Medications   metFORMIN (GLUCOPHAGE) 500 MG  tablet   Paresthesia    Foot exam WNL. Update B12 No results found for: VITAMINB12       Relevant Orders   Vitamin B12    Other Visit Diagnoses   None.      Follow up plan: Return in about 5 months (around 01/29/2017), or as needed, for follow up visit.  Eustaquio Boyden, MD

## 2016-09-01 NOTE — Assessment & Plan Note (Signed)
Chronic, stable. Tolerating metformin 500mg  once daily, continue. Refer to diabetes education. Foot exam today. Upcoming eye exam. Provided with DM and food handout. RTC 5 mo f/u visit. Pt agrees with plan.

## 2016-09-01 NOTE — Assessment & Plan Note (Signed)
Foot exam WNL. Update B12 No results found for: VITAMINB12

## 2016-09-01 NOTE — Assessment & Plan Note (Signed)
Update with better sugar control, will consider statin if remaining elevated.

## 2016-09-01 NOTE — Assessment & Plan Note (Signed)
Reviewed recent healthy diet changes, encouraged continued attempts to lose weight.

## 2016-09-05 DIAGNOSIS — E119 Type 2 diabetes mellitus without complications: Secondary | ICD-10-CM | POA: Diagnosis not present

## 2016-09-05 DIAGNOSIS — H40033 Anatomical narrow angle, bilateral: Secondary | ICD-10-CM | POA: Diagnosis not present

## 2016-09-05 LAB — HM DIABETES EYE EXAM

## 2016-09-18 ENCOUNTER — Encounter: Payer: Self-pay | Admitting: Family Medicine

## 2017-02-01 ENCOUNTER — Ambulatory Visit: Payer: BLUE CROSS/BLUE SHIELD | Admitting: Family Medicine

## 2017-06-03 ENCOUNTER — Encounter: Payer: Self-pay | Admitting: Family Medicine

## 2017-06-03 ENCOUNTER — Ambulatory Visit (INDEPENDENT_AMBULATORY_CARE_PROVIDER_SITE_OTHER): Payer: BLUE CROSS/BLUE SHIELD | Admitting: Family Medicine

## 2017-06-03 VITALS — BP 122/74 | HR 74 | Temp 98.4°F | Wt 180.0 lb

## 2017-06-03 DIAGNOSIS — J208 Acute bronchitis due to other specified organisms: Secondary | ICD-10-CM

## 2017-06-03 DIAGNOSIS — B9689 Other specified bacterial agents as the cause of diseases classified elsewhere: Secondary | ICD-10-CM | POA: Diagnosis not present

## 2017-06-03 MED ORDER — AZITHROMYCIN 250 MG PO TABS
ORAL_TABLET | ORAL | 0 refills | Status: DC
Start: 2017-06-03 — End: 2017-06-25

## 2017-06-03 MED ORDER — GUAIFENESIN-CODEINE 100-10 MG/5ML PO SYRP: 5.0000 mL | ORAL_SOLUTION | Freq: Every evening | ORAL | 0 refills | Status: DC | PRN

## 2017-06-03 NOTE — Assessment & Plan Note (Signed)
Ongoing for 2.5 months in diabetic, not resolved with OTC remedies. Will cover for atypical infection with zpack. cheratussin for cough (sugar free). I asked her to return for CXR if not better with treatment.

## 2017-06-03 NOTE — Patient Instructions (Signed)
Creo que tiene bronquitis - tome azithromycina antibiotico y use jarabe con codeina para la tos.  Puede seguir usando aleve para inflamacion de bronquios.  Si no mejora con esto, regrese para rayos x.  Bronquitis aguda en los adultos (Acute Bronchitis, Adult) La bronquitis aguda es la hinchazn repentina (aguda) de las vas areas (bronquios) en los pulmones. La bronquitis aguda hace que se llenen estas vas con mucosidad y provoca dificultad para respirar. Tambin puede causar tos o sibilancias. En los adultos, la bronquitis aguda generalmente desaparece en 2semanas. La tos provocada por la bronquitis puede durar [employer]. El hbito de fumar, las Environmental consultant y el asma pueden empeorar esta afeccin. Los episodios repetidos de bronquitis pueden causar ms problemas pulmonares, como la enfermedad pulmonar obstructiva crnica (EPOC). CAUSAS Esta afeccin puede ser causada por grmenes y por sustancias que irritan los pulmones, por ejemplo:  Virus del resfro y de la gripe. La causa de la bronquitis suele ser el mismo virus que produce el resfro.  Bacterias.  Exposicin al humo del tabaco, polvo, gases y contaminacin del aire. FACTORES DE RIESGO Es ms probable que esta afeccin se manifieste en las personas que:  Tienen contacto cercano con alguien con bronquitis aguda.  Estn expuestas a sustancias que [employer], como el humo del [place], [place], gases y vapores.  Tienen un sistema inmunitario dbil.  Tienen una afeccin respiratoria, como el asma. SNTOMAS Los sntomas de esta afeccin incluyen lo siguiente:  Tos.  Despedir Neomia Dear mucosidad transparente, amarilla o verde al toser.  Sibilancias.  Congestin en el pecho.  Falta de aire.  Grant Ruts.  Dolores [employer] cuerpo.  Escalofros.  Dolor de Advertising copywriter. DIAGNSTICO Por lo general, esta afeccin se diagnostica mediante un examen fsico. Durante el examen, el mdico puede solicitar estudios, como radiografas de  trax, para Administrator. El mdico tambin puede hacer lo siguiente:  Danna Hefty de mucosidad para detectar una infeccin bacteriana.  Confirmar el nivel de oxgeno en la sangre. Esto se hace para determinar si hay neumona.  Realizar una radiografa de trax o pruebas de la funcin pulmonar para descartar una neumona u otras afecciones.  Le pedir DIRECTV. El mdico tambin le preguntar sobre sus sntomas y los antecedentes mdicos. TRATAMIENTO La mayora de los casos de bronquitis [place] se recupera con el [place], sin tratamiento. El mdico podr indicar lo siguiente:  Beber ms lquidos. Beber ms cantidad de lquidos ayuda a diluir la mucosidad para Research officer, political party respiracin.  Tomar un medicamento para la fiebre o la tos.  Tomar un antibitico.  Usar un inhalador para respirar mejor y Scientist, physiological tos.  Usar un humidificador o vaporizador de niebla fra para facilitar la respiracin. INSTRUCCIONES PARA EL CUIDADO EN EL HOGAR Medicamentos  [employer] de venta libre y los recetados solamente como se lo haya indicado el mdico.  Si le recetaron un antibitico, tmelo como se lo haya indicado el mdico. No deje de tomar los antibiticos aunque comience a Actor. Instrucciones generales  Descanse lo suficiente.  Beba suficiente lquido para Photographer orina clara o de color amarillo plido.  Evite fumar o aspirar el humo de otros fumadores. La exposicin al humo del cigarrillo o a irritantes qumicos har que la bronquitis empeore. Si fuma y necesita ayuda para dejar de fumar, consulte al mdico. Si deja de fumar, sus pulmones se curarn ms rpido.  Utilice Advertising account executive, un humidificador o un vaporizador de niebla fra, como se lo haya indicado  el mdico.  Concurra a todas las visitas de control como se lo haya indicado el mdico. Esto es importante. PREVENCIN Para disminuir el riesgo de volver a sufrir esta  afeccin:  Lave sus manos frecuentemente con agua y [place]. Use desinfectante para manos si no dispone de [place] y [place].  Evite el contacto con personas que tienen sntomas de resfro.  Trate de no llevar las manos a la boca, la nariz o los ojos.  Recuerde aplicarse la vacuna contra la gripe todos los [place]. SOLICITE ATENCIN MDICA SI:  Los sntomas no mejoran despus de 2semanas de [place] Janettratamiento.  SOLICITE ATENCIN MDICA DE INMEDIATO SI:  Tose y escupe sangre.  Siente dolor en el pecho.  Le falta el aire de manera preocupante.  Se deshidrata.  Se desmaya o se siente como si se fuera a desmayar.  Sigue vomitando.  Tiene un dolor de cabeza intenso.  La fiebre o los escalofros empeoran.  Esta informacin no tiene Theme park managercomo fin reemplazar el consejo del mdico. Asegrese de hacerle al mdico cualquier pregunta que tenga. Document Released: 11/05/2005 Document Revised: 11/26/2014 Document Reviewed: 04/25/2016 Elsevier Interactive Patient Education  2017 [employer]

## 2017-06-03 NOTE — Progress Notes (Signed)
BP 122/74   Pulse 74   Temp 98.4 F (36.9 C) (Oral)   Wt 180 lb (81.6 kg)   SpO2 97%   BMI 31.89 kg/m    CC: cough Subjective:    Patient ID: Paige Caldwell, female    DOB: 1961/02/07, 56 y.o.   MRN: [id number]  HPI: Paige Caldwell is a 56 y.o. female presenting on 06/03/2017 for Cough and Sore Throat   2.5 mo h/o cough, chest congestion, sore throat. Trouble getting mucous out. L>R ear muffled. Dry cough worse at night time with coughing fits and gagging. Some headache that is now better.   No fevers/chills, nasal congestion.  No unexpected weight loss.   No sick contacts at home.  Has tried aleve which helps. She also tried robitussin without significant improvement. Drinking water lemon and teas which help. No h/o asthma No h/o smoking  Does not suffer from GERD or allergic rhinitis.   Relevant past medical, surgical, family and social history reviewed and updated as indicated. Interim medical history since our last visit reviewed. Allergies and medications reviewed and updated. Outpatient Medications Prior to Visit  Medication Sig Dispense Refill  . metFORMIN (GLUCOPHAGE) 500 MG tablet Take 1 tablet (500 mg total) by mouth daily with breakfast. 90 tablet 3   No facility-administered medications prior to visit.      Per HPI unless specifically indicated in ROS section below Review of Systems     Objective:    BP 122/74   Pulse 74   Temp 98.4 F (36.9 C) (Oral)   Wt 180 lb (81.6 kg)   SpO2 97%   BMI 31.89 kg/m   Wt Readings from Last 3 Encounters:  06/03/17 180 lb (81.6 kg)  08/31/16 179 lb 4 oz (81.3 kg)  05/29/16 180 lb (81.6 kg)    Physical Exam  Constitutional: She appears well-developed and well-nourished. No distress.  HENT:  Head: Normocephalic and atraumatic.  Right Ear: Hearing, tympanic membrane, external ear and ear canal normal.  Left Ear: Hearing, tympanic membrane, external ear and ear canal normal.  Nose: No mucosal edema or rhinorrhea.  Right sinus exhibits no maxillary sinus tenderness and no frontal sinus tenderness. Left sinus exhibits no maxillary sinus tenderness and no frontal sinus tenderness.  Mouth/Throat: Uvula is midline and mucous membranes are normal. Posterior oropharyngeal erythema present. No oropharyngeal exudate, posterior oropharyngeal edema or tonsillar abscesses.  Eyes: Pupils are equal, round, and reactive to light. Conjunctivae and EOM are normal. No scleral icterus.  Neck: Normal range of motion. Neck supple.  Cardiovascular: Normal rate, regular rhythm, normal heart sounds and intact distal pulses.   No murmur heard. Pulmonary/Chest: Effort normal and breath sounds normal. No respiratory distress. She has no wheezes. She has no rales.  Lungs overall clear Harsh nagging cough present  Lymphadenopathy:    She has no cervical adenopathy.  Skin: Skin is warm and dry. No rash noted.  Nursing note and vitals reviewed.  Lab Results  Component Value Date   HGBA1C 6.7 (H) 05/25/2016       Assessment & Plan:   Problem List Items Addressed This Visit    Acute bacterial bronchitis - Primary    Ongoing for 2.5 months in diabetic, not resolved with OTC remedies. Will cover for atypical infection with zpack. cheratussin for cough (sugar free). I asked her to return for CXR if not better with treatment.       Relevant Medications   azithromycin (ZITHROMAX) 250 MG tablet  Follow up plan: Return if symptoms worsen or fail to improve.  Ria Bush, MD

## 2017-06-05 DIAGNOSIS — H524 Presbyopia: Secondary | ICD-10-CM | POA: Diagnosis not present

## 2017-06-05 DIAGNOSIS — E119 Type 2 diabetes mellitus without complications: Secondary | ICD-10-CM | POA: Diagnosis not present

## 2017-06-05 DIAGNOSIS — H40033 Anatomical narrow angle, bilateral: Secondary | ICD-10-CM | POA: Diagnosis not present

## 2017-06-05 LAB — HM DIABETES EYE EXAM

## 2017-06-21 ENCOUNTER — Other Ambulatory Visit (INDEPENDENT_AMBULATORY_CARE_PROVIDER_SITE_OTHER): Payer: BLUE CROSS/BLUE SHIELD

## 2017-06-21 DIAGNOSIS — E119 Type 2 diabetes mellitus without complications: Secondary | ICD-10-CM

## 2017-06-21 DIAGNOSIS — R202 Paresthesia of skin: Secondary | ICD-10-CM

## 2017-06-21 DIAGNOSIS — E782 Mixed hyperlipidemia: Secondary | ICD-10-CM | POA: Diagnosis not present

## 2017-06-21 LAB — BASIC METABOLIC PANEL
BUN: 12 mg/dL (ref 6–23)
CHLORIDE: 104 meq/L (ref 96–112)
CO2: 29 meq/L (ref 19–32)
CREATININE: 0.7 mg/dL (ref 0.40–1.20)
Calcium: 9.2 mg/dL (ref 8.4–10.5)
GFR: 91.64 mL/min (ref >60.00)
Glucose, Bld: 120 mg/dL — ABNORMAL HIGH (ref 70–99)
Potassium: 4.2 mEq/L (ref 3.5–5.1)
SODIUM: 139 meq/L (ref 135–145)

## 2017-06-21 LAB — LIPID PANEL
CHOLESTEROL: 247 mg/dL — AB (ref 0–200)
HDL: 49.1 mg/dL (ref >39.00)
LDL Cholesterol: 165 mg/dL — ABNORMAL HIGH (ref 0–99)
NonHDL: 198.23
TRIGLYCERIDES: 164 mg/dL — AB (ref 0.0–149.0)
Total CHOL/HDL Ratio: 5
VLDL: 32.8 mg/dL (ref 0.0–40.0)

## 2017-06-21 LAB — VITAMIN B12: VITAMIN B 12: 546 pg/mL (ref 211–911)

## 2017-06-21 LAB — HEMOGLOBIN A1C: HEMOGLOBIN A1C: 7 % — AB (ref 4.6–6.5)

## 2017-06-25 ENCOUNTER — Ambulatory Visit (INDEPENDENT_AMBULATORY_CARE_PROVIDER_SITE_OTHER): Payer: BLUE CROSS/BLUE SHIELD | Admitting: Family Medicine

## 2017-06-25 ENCOUNTER — Encounter: Payer: Self-pay | Admitting: Family Medicine

## 2017-06-25 VITALS — BP 128/82 | HR 79 | Temp 97.3°F | Resp 16 | Ht 63.0 in | Wt 179.0 lb

## 2017-06-25 DIAGNOSIS — H40039 Anatomical narrow angle, unspecified eye: Secondary | ICD-10-CM | POA: Insufficient documentation

## 2017-06-25 DIAGNOSIS — K760 Fatty (change of) liver, not elsewhere classified: Secondary | ICD-10-CM | POA: Diagnosis not present

## 2017-06-25 DIAGNOSIS — F4321 Adjustment disorder with depressed mood: Secondary | ICD-10-CM | POA: Diagnosis not present

## 2017-06-25 DIAGNOSIS — E669 Obesity, unspecified: Secondary | ICD-10-CM | POA: Diagnosis not present

## 2017-06-25 DIAGNOSIS — Z23 Encounter for immunization: Secondary | ICD-10-CM

## 2017-06-25 DIAGNOSIS — E119 Type 2 diabetes mellitus without complications: Secondary | ICD-10-CM | POA: Diagnosis not present

## 2017-06-25 DIAGNOSIS — E782 Mixed hyperlipidemia: Secondary | ICD-10-CM

## 2017-06-25 DIAGNOSIS — H579 Unspecified disorder of eye and adnexa: Secondary | ICD-10-CM

## 2017-06-25 DIAGNOSIS — Z0000 Encounter for general adult medical examination without abnormal findings: Secondary | ICD-10-CM | POA: Diagnosis not present

## 2017-06-25 NOTE — Assessment & Plan Note (Signed)
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
## 2017-06-25 NOTE — Assessment & Plan Note (Signed)
Discussed concerns of recent DM dx with patient, support provided. Encouraged increased activity as healthy way to relieve stress and improve health.

## 2017-06-25 NOTE — Patient Instructions (Addendum)
Llame para hacer cita para mamograma.  Pneumovax hoy - vacuna contra pneumonia.  Regresar en 4 meses para proxima cita.  Siga con cambios saludables en la dieta y ejercicio - que vamos notando mejoramiento de laboratorios.   Mantenimiento de Radiographer, therapeutic - Mujeres (Health Maintenance, Female) Un estilo de vida saludable y los cuidados preventivos pueden favorecer considerablemente a la salud y Counsellor. Pregunte a su mdico cul es el cronograma de exmenes peridicos apropiado para usted. Esta es una buena oportunidad para consultarlo sobre cmo prevenir enfermedades y [place] sano. Adems de los controles, hay muchas otras cosas que puede hacer usted mismo. Los expertos han realizado numerosas investigaciones [employer] cambios en el estilo de vida y las medidas de prevencin que, [employer], lo ayudarn a mantenerse sano. Solicite a su mdico ms informacin. EL PESO Y LA DIETA Consuma una dieta saludable.  Asegrese de [employer] verduras, frutas, productos lcteos de bajo contenido de [place] y Associate Professor.  No consuma muchos alimentos de alto contenido de grasas slidas, azcares agregados o sal.  Realice actividad fsica con regularidad. Esta es una de las prcticas ms importantes que puede hacer por su salud. ? La Gwendolyn Fill de los adultos deben hacer ejercicio durante al menos por semana. El ejercicio debe aumentar la frecuencia cardaca y Development worker, international aid la transpiracin (ejercicio de [place]). ? La mayora de los adultos tambin deben hacer ejercicios de elongacin al [employer] veces a la semana. Agregue esto al su plan de ejercicio de intensidad moderada. Mantenga un peso saludable.  El ndice de masa corporal [facility]) es una medida que puede utilizarse para identificar posibles problemas de [place]. Proporciona una estimacin de la grasa corporal basndose en el peso y la altura. Su mdico puede ayudarle a Engineer, site IMC y a Personnel officer o Pharmacologist un peso  saludable.  Para las mujeres de 20aos o ms: ? Un [facility] menor de 18,5 se considera bajo peso. ? Un [facility] entre 18,5 y 24,9 es normal. ? Un [facility] entre 25 y 29,9 se considera sobrepeso. ? Un IMC de 30 o ms se considera obesidad. Observe los niveles de colesterol y lpidos en la sangre.  Debe comenzar a Heritage manager de lpidos y Oncologist en la sangre a los 20aos y luego repetirlos cada 5aos.  Es posible que Insurance underwriter los niveles de colesterol con mayor frecuencia si: ? Sus niveles de lpidos y colesterol son altos. ? Es mayor de 73CAF. ? Presenta un alto riesgo de padecer enfermedades cardacas. DETECCIN DE CNCER Cncer de pulmn  Se recomienda realizar exmenes de deteccin de cncer de pulmn a personas adultas entre 55 y 80 aos que estn en riesgo de Geophysical data processor de pulmn por sus antecedentes de consumo de tabaco.  Se recomienda una tomografa computarizada de baja dosis de los pulmones todos los aos a las personas que: ? Fuman actualmente. ? Hayan dejado el hbito en algn momento en los ltimos 15aos. ? Hayan fumado durante 30aos un paquete diario. Un paquete-ao equivale a fumar un promedio de un paquete de cigarrillos diario durante un ao.  Los exmenes de deteccin anuales deben continuar hasta que hayan pasado 15aos desde que dej de fumar.  Ya no debern realizarse si tiene un problema de salud que le impida recibir tratamiento para Management consultant de pulmn. Cncer de mama  Practique la autoconciencia de la mama. Esto significa reconocer la apariencia normal de sus mamas y cmo las siente.  Tambin significa realizar autoexmenes regulares de [place].  Informe a su mdico sobre cualquier cambio, sin importar cun pequeo sea.  Si tiene entre 20 y 2 aos, un mdico debe realizarle un examen clnico de las mamas como parte del examen regular de [place], cada 1 a 3aos.  Si tiene 40aos o ms, debe Information systems manager clnico de las [employer]. Tambin considere realizarse una [place] todos los [place].  Si tiene antecedentes familiares de cncer de mama, hable con su mdico para someterse a un estudio gentico.  Si tiene alto riesgo de Chief Financial Officer de mama, hable con su mdico para someterse a Public house manager y [employer].  La evaluacin del gen del cncer de mama (BRCA) se recomienda a mujeres que tengan familiares con cnceres relacionados con el BRCA. Los cnceres relacionados con el BRCA incluyen los siguientes: ? [place]. ? Ovario. ? Trompas. ? Cnceres de peritoneo.  Los resultados de la evaluacin determinarn la necesidad de asesoramiento gentico y de [place] de BRCA1 y BRCA2. Cncer de cuello del tero El mdico puede recomendarle que se haga pruebas peridicas de deteccin de cncer de los rganos de la pelvis (ovarios, tero y vagina). Estas pruebas incluyen un examen plvico, que abarca controlar si se produjeron cambios microscpicos en la superficie del cuello del tero (prueba de Papanicolaou). Pueden recomendarle que se haga estas pruebas cada 3aos, a partir de los 21aos.  A las mujeres que tienen entre 30 y 48aos, los mdicos pueden recomendarles que se sometan a exmenes plvicos y pruebas de Papanicolaou cada 86aos, o a la prueba de Papanicolaou y el examen plvico en combinacin con estudios de deteccin del virus del papiloma humano (VPH) cada 5aos. Algunos tipos de VPH aumentan el riesgo de Chief Financial Officer de cuello del tero. La prueba para la deteccin del VPH tambin puede realizarse a mujeres de cualquier edad cuyos resultados de la prueba de Papanicolaou no sean claros.  Es posible que otros mdicos no recomienden exmenes de deteccin a mujeres no embarazadas que se consideran sujetos de bajo riesgo de Chief Financial Officer de pelvis y que no tienen sntomas. Pregntele al mdico si un examen plvico de deteccin es adecuado para  usted.  Si ha recibido un tratamiento para Science writer cervical o una enfermedad que podra causar cncer, necesitar realizarse una prueba de Papanicolaou y controles durante al menos 50 aos de concluido el [place]. Si no se ha hecho el Papanicolaou con regularidad, debern volver a evaluarse los factores de riesgo (como tener un nuevo compaero sexual), para Teacher, adult education si debe realizarse los estudios nuevamente. Algunas mujeres sufren problemas mdicos que aumentan la probabilidad de Museum/gallery curator cncer de cuello del tero. En estos casos, el mdico podr [employer] se realicen controles y pruebas de Papanicolaou con ms frecuencia. Cncer colorrectal  Este tipo de cncer puede detectarse y a menudo prevenirse.  Por lo general, los estudios de rutina se deben Medical laboratory scientific officer a Field seismologist a Proofreader de los 25 aos y Anthem 51 aos.  Sin embargo, el mdico podr aconsejarle que lo haga antes, si tiene factores de riesgo para el cncer de colon.  Tambin puede recomendarle que use un kit de prueba para Hydrologist en la materia fecal.  Es posible que se use una pequea cmara en el extremo de un tubo para examinar directamente el colon (sigmoidoscopia o colonoscopia) a fin de Hydrographic surveyor formas tempranas de cncer colorrectal.  Los exmenes de rutina generalmente comienzan a los 37aos.  El examen directo del  colon se debe repetir cada 5 a 10aos hasta los 75aos. Sin embargo, es posible que se realicen exmenes con mayor frecuencia, si se detectan formas tempranas de plipos precancerosos o pequeos bultos. Cncer de piel  Revise la piel de la cabeza a los pies con regularidad.  Informe a su mdico si aparecen nuevos lunares o los que tiene se modifican, especialmente en su forma y color.  Tambin notifique al mdico si tiene un lunar que es ms grande que el tamao de una goma de lpiz.  Siempre use pantalla solar. Aplique pantalla solar de Kerry Dory y repetida a lo largo del  Training and development officer.  Protjase usando mangas y [employer], un sombrero de ala ancha y gafas para el sol, siempre que se encuentre en el exterior. ENFERMEDADES CARDACAS, DIABETES E HIPERTENSIN ARTERIAL  La hipertensin arterial causa enfermedades cardacas y [place] el riesgo de ictus. La hipertensin arterial es ms probable en los siguientes casos: ? Las personas que tienen la presin arterial en el extremo del rango normal (100-139/85-89 mm Hg). ? [employer] con sobrepeso u obesidad. ? Scientist, water quality.  Si usted tiene entre 18 y 39 aos, debe medirse la presin arterial cada 3 a 5 aos. Si usted tiene 40 aos o ms, debe medirse la presin arterial [employer]. Debe medirse la presin arterial dos veces: una vez cuando est en un hospital o una clnica y la otra vez cuando est en otro sitio. Registre el promedio de [employer]. Para controlar su presin arterial cuando no est en un hospital o [place], puede usar lo siguiente: ? Jorje Guild automtica para medir la presin arterial en una farmacia. ? Un monitor para medir la presin arterial en el hogar.  Si tiene entre 69 y 63 aos, consulte a su mdico si debe tomar aspirina para prevenir el ictus.  Realcese exmenes de deteccin de la diabetes con regularidad. Esto incluye la toma de [place] de sangre para controlar el nivel de azcar en la sangre durante el [place]. ? Si tiene un peso normal y un bajo riesgo de padecer diabetes, realcese este anlisis cada tres aos despus de los 45aos. ? Si tiene sobrepeso y un alto riesgo de padecer diabetes, considere someterse a este anlisis antes o con mayor frecuencia. PREVENCIN DE INFECCIONES HepatitisB  Si tiene un riesgo ms alto de Museum/gallery curator hepatitis B, debe someterse a un examen de deteccin de este virus. Se considera que tiene un alto riesgo de contraer hepatitis B si: ? Naci en un pas donde la hepatitis B es frecuente. Pregntele a su mdico qu pases son  considerados de Public affairs consultant. ? Sus padres nacieron en un pas de alto riesgo y usted no recibi una vacuna que lo proteja contra la hepatitis B (vacuna contra la hepatitis B). ? [place]. ? [place] agujas para inyectarse drogas. ? Vive con alguien que tiene hepatitis B. ? Ha tenido sexo con alguien que tiene hepatitis B. ? Recibe tratamiento de hemodilisis. ? Toma ciertos medicamentos para el cncer, trasplante de rganos y afecciones autoinmunitarias. Hepatitis C  Se recomienda un anlisis de [place] para: ? [employer] 1945 y 1965. ? Todas las personas que tengan un riesgo de haber contrado hepatitis C. Enfermedades de transmisin sexual (ETS).  Debe realizarse pruebas de deteccin de enfermedades de transmisin sexual (ETS), incluidas gonorrea y clamidia si: ? Es sexualmente activo y es menor de 40JWJ. ? Es mayor de 24aos, y Investment banker, operational  informa que corre riesgo de [employer] tipo de infecciones. ? La actividad sexual ha cambiado desde que le hicieron la ltima prueba de deteccin y tiene un riesgo mayor de Best boy clamidia o Radio broadcast assistant. Pregntele al mdico si usted tiene riesgo.  Si no tiene el VIH, pero corre riesgo de infectarse por el virus, se recomienda tomar diariamente un medicamento recetado para evitar la infeccin. Esto se conoce como profilaxis previa a la exposicin. Se considera que est en riesgo si: ? Es [place] sexualmente y no [place] preservativos habitualmente o no conoce el estado del VIH de sus Advertising copywriter. ? Se inyecta drogas. ? Es [place] sexualmente con Ardelia Mems pareja que tiene VIH. Consulte a su mdico para saber si tiene un alto riesgo de infectarse por el VIH. Si opta por comenzar la profilaxis previa a la exposicin, primero debe realizarse anlisis de deteccin del VIH. Luego, le harn anlisis cada 58mses mientras est tomando los medicamentos para la profilaxis previa a la exposicin. [facility] Si es premenopusica y puede quedar  eElliott solicite a su mdico asesoramiento previo a la concepcin.  Si puede quedar embarazada, tome 400 a [id number](mcg) de cido fAnheuser-Busch  Si desea evitar el embarazo, hable con su mdico sobre el control de la natalidad (anticoncepcin). OSTEOPOROSIS Y MENOPAUSIA  La osteoporosis es una enfermedad en la que los huesos pierden los minerales y la fuerza por el avance de la edad. El resultado pueden ser fracturas graves en los hFort Thompson El riesgo de osteoporosis puede identificarse con uArdelia Memsprueba de densidad sea.  Si tiene 65aos o ms, o si est en riesgo de sufrir osteoporosis y fracturas, pregunte a su mdico si debe someterse a exmenes.  Consulte a su mdico si debe tomar un suplemento de calcio o de vitamina D para reducir el riesgo de osteoporosis.  La menopausia puede presentar ciertos sntomas fsicos y rGaffer  La terapia de reemplazo hormonal puede reducir algunos de estos sntomas y rGaffer Consulte a su mdico para saber si la terapia de reemplazo hormonal es conveniente para usted. INSTRUCCIONES PARA EL CUIDADO EN EL HOGAR  Realcese los estudios de rutina de la salud, dentales y de lPublic librarian  MParcelas Penuelas  No consuma ningn producto que contenga tabaco, lo que incluye cigarrillos, tabaco de mHigher education careers advisero cPsychologist, sport and exercise  Si est embarazada, no beba alcohol.  Si est amamantando, reduzca el consumo de alcohol y la frecuencia con la que consume.  Si es mujer y no est embarazada limite el consumo de alcohol a no ms de 1 medida por da. Una medida equivale a 12onzas de cerveza, 5onzas de vino o 1onzas de bebidas alcohlicas de alta graduacin.  No consuma drogas.  No comparta agujas.  Solicite ayuda a su mdico si necesita apoyo o informacin para abandonar las drogas.  Informe a su mdico si a menudo se siente deprimido.  Notifique a su mdico si alguna vez ha sido vctima de abuso o si no se siente seguro  en su hogar. Esta informacin no tiene cMarine scientistel consejo del mdico. Asegrese de hacerle al mdico cualquier pregunta que tenga. Document Released: 10/25/2011 Document Revised: 11/26/2014 Document Reviewed: 08/09/2015 Elsevier Interactive Patient Education  2[employer]

## 2017-06-25 NOTE — Assessment & Plan Note (Signed)
Ongoing RUQ discomfort. Will see if we can add LFTs to blood in lab. Consider updated US.

## 2017-06-25 NOTE — Assessment & Plan Note (Signed)
Chronic. Reviewed with patient. Did not tolerate metformin. Suggested re -trial, with larger meal each day. RTC 3-4 mo f/u visit. Pt agrees with plan.

## 2017-06-25 NOTE — Assessment & Plan Note (Signed)
Preventative protocols reviewed and updated unless pt declined. Discussed healthy diet and lifestyle.  

## 2017-06-25 NOTE — Assessment & Plan Note (Signed)
Actually improved but LDL remains elevated. Discussed possibly restarting statin. H/o myalgias to simvastatin in the past.

## 2017-06-25 NOTE — Assessment & Plan Note (Signed)
Encouraged healthy diet and lifestyle change to affect sustainable weight loss.  

## 2017-06-25 NOTE — Addendum Note (Signed)
Addended by: Garfield CorneaMABRY, Rico Massar L on: 06/25/2017 04:06 PM   Modules accepted: Orders

## 2017-06-27 ENCOUNTER — Encounter: Payer: Self-pay | Admitting: Family Medicine

## 2017-08-23 ENCOUNTER — Other Ambulatory Visit: Payer: Self-pay | Admitting: Family Medicine

## 2017-08-23 DIAGNOSIS — Z1231 Encounter for screening mammogram for malignant neoplasm of breast: Secondary | ICD-10-CM

## 2017-09-04 ENCOUNTER — Ambulatory Visit
Admission: RE | Admit: 2017-09-04 | Discharge: 2017-09-04 | Disposition: A | Discharge status: Home / Self Care | Payer: BLUE CROSS/BLUE SHIELD | Source: Ambulatory Visit | Attending: Family Medicine | Admitting: Family Medicine

## 2017-09-04 ENCOUNTER — Encounter: Payer: Self-pay | Admitting: Family Medicine

## 2017-09-04 DIAGNOSIS — Z1231 Encounter for screening mammogram for malignant neoplasm of breast: Secondary | ICD-10-CM | POA: Diagnosis not present

## 2017-09-04 LAB — HM MAMMOGRAPHY

## 2017-10-25 ENCOUNTER — Ambulatory Visit: Payer: BLUE CROSS/BLUE SHIELD | Admitting: Family Medicine

## 2017-10-25 ENCOUNTER — Encounter: Payer: Self-pay | Admitting: Family Medicine

## 2017-10-25 VITALS — BP 140/76 | HR 76 | Temp 98.4°F | Wt 179.0 lb

## 2017-10-25 DIAGNOSIS — E559 Vitamin D deficiency, unspecified: Secondary | ICD-10-CM

## 2017-10-25 DIAGNOSIS — Z23 Encounter for immunization: Secondary | ICD-10-CM

## 2017-10-25 DIAGNOSIS — K760 Fatty (change of) liver, not elsewhere classified: Secondary | ICD-10-CM

## 2017-10-25 DIAGNOSIS — E119 Type 2 diabetes mellitus without complications: Secondary | ICD-10-CM

## 2017-10-25 DIAGNOSIS — H40039 Anatomical narrow angle, unspecified eye: Secondary | ICD-10-CM | POA: Diagnosis not present

## 2017-10-25 DIAGNOSIS — R030 Elevated blood-pressure reading, without diagnosis of hypertension: Secondary | ICD-10-CM | POA: Diagnosis not present

## 2017-10-25 NOTE — Assessment & Plan Note (Addendum)
Chronic. Pt endorses better tolerance of metformin. Declines foot exam today. Agrees to diabetes classes. Will check with insurance on preferred glucometer brand. Update labs today.

## 2017-10-25 NOTE — Progress Notes (Addendum)
BP 140/76 (BP Location: Left Arm, Patient Position: Sitting, Cuff Size: Normal)   Pulse 76   Temp 98.4 F (36.9 C) (Oral)   Wt 179 lb (81.2 kg)   SpO2 97%   BMI 31.71 kg/m   bp remains elevated on recheck  CC: 4 mo f/u visit Subjective:    Patient ID: Paige Caldwell, female    DOB: 1961-10-19, 56 y.o.   MRN: [id number]  HPI: Paige Caldwell is a 56 y.o. female presenting on 10/25/2017 for 4 mo follow-up   See prior note for details.  DM - does not regularly check sugars. Compliant with antihyperglycemic regimen which includes: metformin 500mg  with lunch. Denies low sugars or hypoglycemic symptoms. Denies paresthesias. Last diabetic eye exam 06/2017. Pneumovax: 06/2017. Prevnar: not due. Glucometer brand: does not have one. DSME: not completed. Lab Results  Component Value Date   HGBA1C 7.0 (H) 06/21/2017   Diabetic Foot Exam - Simple   No data filed    declines foot exam today.  Lab Results  Component Value Date   MICROALBUR 1.1 03/23/2015     Relevant past medical, surgical, family and social history reviewed and updated as indicated. Interim medical history since our last visit reviewed. Allergies and medications reviewed and updated. Outpatient Medications Prior to Visit  Medication Sig Dispense Refill  . metFORMIN (GLUCOPHAGE) 500 MG tablet Take 1 tablet (500 mg total) by mouth daily with breakfast. 90 tablet 3   No facility-administered medications prior to visit.      Per HPI unless specifically indicated in ROS section below Review of Systems     Objective:    BP 140/76 (BP Location: Left Arm, Patient Position: Sitting, Cuff Size: Normal)   Pulse 76   Temp 98.4 F (36.9 C) (Oral)   Wt 179 lb (81.2 kg)   SpO2 97%   BMI 31.71 kg/m   Wt Readings from Last 3 Encounters:  10/25/17 179 lb (81.2 kg)  06/25/17 179 lb (81.2 kg)  06/03/17 180 lb (81.6 kg)    BP Readings from Last 3 Encounters:  10/25/17 140/76  06/25/17 128/82  06/03/17 122/74    Physical  Exam  Constitutional: She appears well-developed and well-nourished. No distress.  HENT:  Head: Normocephalic and atraumatic.  Right Ear: External ear normal.  Left Ear: External ear normal.  Nose: Nose normal.  Mouth/Throat: Oropharynx is clear and moist. No oropharyngeal exudate.  Eyes: Conjunctivae and EOM are normal. Pupils are equal, round, and reactive to light. No scleral icterus.  Neck: Normal range of motion. Neck supple.  Cardiovascular: Normal rate, regular rhythm, normal heart sounds and intact distal pulses.  No murmur heard. Pulmonary/Chest: Effort normal and breath sounds normal. No respiratory distress. She has no wheezes. She has no rales.  Musculoskeletal: She exhibits no edema.  See HPI for foot exam if done --> pt declined today  Lymphadenopathy:    She has no cervical adenopathy.  Skin: Skin is warm and dry. No rash noted.  Psychiatric: She has a normal mood and affect.  Nursing note and vitals reviewed.  Results for orders placed or performed in visit on 09/04/17  HM MAMMOGRAPHY  Result Value Ref Range   HM Mammogram 0-4 Bi-Rad 0-4 Bi-Rad, Self Reported Normal      Assessment & Plan:   Problem List Items Addressed This Visit    Anatomical narrow angle    Encouraged f/u with ophtho for further evaluation of this finding.      Controlled diabetes mellitus type  II without complication (HCC) - Primary    Chronic. Pt endorses better tolerance of metformin. Declines foot exam today. Agrees to diabetes classes. Will check with insurance on preferred glucometer brand. Update labs today.       Relevant Orders   Hemoglobin A1c   Basic metabolic panel   Microalbumin / creatinine urine ratio   Ambulatory referral to diabetic education   Elevated blood pressure reading    Elevated on recheck - anticipate stress and rushing here contributing. I have asked her to either buy BP cuff to monitor at home or monitor at local pharmacy, update me if >140/90.       Fatty  liver disease, nonalcoholic   Relevant Orders   Hepatic function panel   Vitamin D deficiency   Relevant Orders   VITAMIN D 25 Hydroxy (Vit-D Deficiency, Fractures)       Follow up plan: Return in about 3 months (around 01/23/2018) for follow up visit.  Eustaquio BoydenJavier Klare Criss, MD

## 2017-10-25 NOTE — Patient Instructions (Addendum)
Flu shot today. Haga cita de nuevo para revision de ojos.  Pase para revisar Geneticist, molecularsangre en el laboratorio.  La mandaremos a [facility] para clases de diabetes.  Empieze a revisar presion en casa o farmacia local 1 vez a la semana - y dejeme saber los [place]resultados. si promedio >140/90, debemos hablar sobre medicamento para control de presion arterial.  Regresar en 3 meses para proximo chequeo de diabetes.

## 2017-10-25 NOTE — Assessment & Plan Note (Signed)
Encouraged f/u with ophtho for further evaluation of this finding.

## 2017-10-25 NOTE — Assessment & Plan Note (Signed)
Elevated on recheck - anticipate stress and rushing here contributing. I have asked her to either buy BP cuff to monitor at home or monitor at local pharmacy, update me if >140/90.

## 2017-10-26 ENCOUNTER — Other Ambulatory Visit: Payer: Self-pay | Admitting: Family Medicine

## 2017-10-26 LAB — MICROALBUMIN / CREATININE URINE RATIO
Creatinine, Urine: 51 mg/dL (ref 20–275)
MICROALB/CREAT RATIO: 10 ug/mg{creat} (ref <30)
Microalb, Ur: 0.5 mg/dL

## 2017-10-26 LAB — BASIC METABOLIC PANEL
BUN: 11 mg/dL (ref 7–25)
CHLORIDE: 101 mmol/L (ref 98–110)
CO2: 27 mmol/L (ref 20–32)
Calcium: 9.8 mg/dL (ref 8.6–10.4)
Creat: 0.66 mg/dL (ref 0.50–1.05)
Glucose, Bld: 108 mg/dL — ABNORMAL HIGH (ref 65–99)
Potassium: 4 mmol/L (ref 3.5–5.3)
Sodium: 136 mmol/L (ref 135–146)

## 2017-10-26 LAB — HEPATIC FUNCTION PANEL
AG Ratio: 1.2 (calc) (ref 1.0–2.5)
ALKALINE PHOSPHATASE (APISO): 100 U/L (ref 33–130)
ALT: 22 U/L (ref 6–29)
AST: 20 U/L (ref 10–35)
Albumin: 4.2 g/dL (ref 3.6–5.1)
BILIRUBIN DIRECT: 0.1 mg/dL (ref 0.0–0.2)
BILIRUBIN INDIRECT: 0.5 mg/dL (ref 0.2–1.2)
BILIRUBIN TOTAL: 0.6 mg/dL (ref 0.2–1.2)
Globulin: 3.6 g/dL (calc) (ref 1.9–3.7)
Total Protein: 7.8 g/dL (ref 6.1–8.1)

## 2017-10-26 LAB — HEMOGLOBIN A1C
HEMOGLOBIN A1C: 6.6 %{Hb} — AB (ref <5.7)
MEAN PLASMA GLUCOSE: 143 (calc)
eAG (mmol/L): 7.9 (calc)

## 2017-10-26 LAB — VITAMIN D 25 HYDROXY (VIT D DEFICIENCY, FRACTURES): Vit D, 25-Hydroxy: 21 ng/mL — ABNORMAL LOW (ref 30–100)

## 2017-10-26 MED ORDER — VITAMIN D 50 MCG (2000 UT) PO CAPS: 1.0000 | ORAL_CAPSULE | Freq: Every day | ORAL | Status: DC

## 2017-10-30 DIAGNOSIS — E119 Type 2 diabetes mellitus without complications: Secondary | ICD-10-CM | POA: Diagnosis not present

## 2017-10-30 DIAGNOSIS — Z23 Encounter for immunization: Secondary | ICD-10-CM | POA: Diagnosis not present

## 2017-10-30 NOTE — Addendum Note (Signed)
Addended by: Nanci PinaGOINS, Ruthie Berch on: 10/30/2017 01:15 PM   Modules accepted: Orders

## 2017-11-05 ENCOUNTER — Encounter: Payer: BLUE CROSS/BLUE SHIELD | Attending: Family Medicine | Admitting: *Deleted

## 2017-11-05 DIAGNOSIS — E119 Type 2 diabetes mellitus without complications: Secondary | ICD-10-CM

## 2017-11-06 NOTE — Progress Notes (Signed)
Diabetes Self-Management Education  Visit Type: First/Initial  Appt. Start Time: 1500 Appt. End Time: 1600  11/06/2017  Ms. Paige Caldwell, identified by name and date of birth, is a 56 y.o. female with a diagnosis of Diabetes: Type 2. She states that her MD is happy with her control, not sure why she is here. She does express willingness to learn more about what diabetes is and to understand what her options are to stay in good control. She works full time as a Neurosurgeonseamstress, she states she likes her job but she does sit all day. She expresses interest in dancing as form of exercise. She does not live close to a gym at this time. Diet history indicates she eats 3 meals a day, no snacks typically and no sweetened beverages.  ASSESSMENT  Height 5\' 4"  (1.626 m), weight 179 lb (81.2 kg). Body mass index is 30.73 kg/m.  Diabetes Self-Management Education - 11/06/17 0905      Visit Information   Visit Type  First/Initial      Initial Visit   Diabetes Type  Type 2    Are you currently following a meal plan?  No    Are you taking your medications as prescribed?  Yes    Date Diagnosed  2017      Health Coping   How would you rate your overall health?  Good      Psychosocial Assessment   Patient Belief/Attitude about Diabetes  Motivated to manage diabetes    Self-care barriers  None    Other persons present  Patient    Patient Concerns  Nutrition/Meal planning;Glycemic Control    Special Needs  None    Learning Readiness  Change in progress    How often do you need to have someone help you when you read instructions, pamphlets, or other written materials from your doctor or pharmacy?  1 - Never    What is the last grade level you completed in school?  6th grade      Pre-Education Assessment   Patient understands the diabetes disease and treatment process.  Needs Instruction    Patient understands incorporating nutritional management into lifestyle.  Needs Instruction    Patient  undertands incorporating physical activity into lifestyle.  Needs Instruction    Patient understands using medications safely.  Needs Review    Patient understands monitoring blood glucose, interpreting and using results  Needs Instruction    Patient understands prevention, detection, and treatment of chronic complications.  Needs Instruction      Complications   Last HgB A1C per patient/outside source  6.6 %    How often do you check your blood sugar?  0 times/day (not testing)    Have you had a dilated eye exam in the past 12 months?  Yes    Have you had a dental exam in the past 12 months?  No    Are you checking your feet?  Yes    How many days per week are you checking your feet?  5      Dietary Intake   Breakfast  egg sandwich with fresh fruit    Lunch  left overs from home with meat, starch and usually vegetables OR soup and tortilla    Dinner  hot meal, meat, starch and vegetables. Mostly [place] foods    Beverage(s)  coffee, water      Exercise   Exercise Type  ADL's used to exercise at gym regularly, not recently due to moving  to [facility] and gym not near her house      Patient Education   Previous Diabetes Education  No    Disease state   Definition of diabetes, type 1 and 2, and the diagnosis of diabetes    Nutrition management   Carbohydrate counting;Meal timing in regards to the patients' current diabetes medication.;Food label reading, portion sizes and measuring food.;Role of diet in the treatment of diabetes and the relationship between the three main macronutrients and blood glucose level    Physical activity and exercise   Helped patient identify appropriate exercises in relation to his/her diabetes, diabetes complications and other health issue.;Role of exercise on diabetes management, blood pressure control and cardiac health.    Monitoring  Taught/discussed recording of test results and interpretation of SMBG.;Purpose and frequency of SMBG.;Identified appropriate  SMBG and/or A1C goals.    Chronic complications  Relationship between chronic complications and blood glucose control    Psychosocial adjustment  Role of stress on diabetes      Individualized Goals (developed by patient)   Nutrition  Follow meal plan discussed;General guidelines for healthy choices and portions discussed    Physical Activity  Exercise 3-5 times per week    Medications  take my medication as prescribed    Monitoring   test blood glucose pre and post meals as discussed consider testing 1-2 times a week as long as results are within target ranges      Post-Education Assessment   Patient understands the diabetes disease and treatment process.  Demonstrates understanding / competency    Patient understands incorporating nutritional management into lifestyle.  Demonstrates understanding / competency    Patient undertands incorporating physical activity into lifestyle.  Demonstrates understanding / competency    Patient understands using medications safely.  Demonstrates understanding / competency    Patient understands monitoring blood glucose, interpreting and using results  Demonstrates understanding / competency    Patient understands prevention, detection, and treatment of chronic complications.  Demonstrates understanding / competency    Patient understands how to develop strategies to address psychosocial issues.  Demonstrates understanding / competency    Patient understands how to develop strategies to promote health/change behavior.  Demonstrates understanding / competency      Outcomes   Expected Outcomes  Demonstrated interest in learning. Expect positive outcomes    Future DMSE  PRN    Program Status  Completed       Individualized Plan for Diabetes Self-Management Training:   Learning Objective:  Patient will have a greater understanding of diabetes self-management. Patient education plan is to attend individual and/or group sessions per assessed needs and  concerns.   Plan:   Patient Instructions   We discussed what types of foods contain carbohydrate and the value of spreading those types of foods evenly throughout the day. You are already doing this but you now understand why this is important.  Consider obtaining a meter and testing your BG once or twice a week alternately before and after a meal to track how your overall control is going.  Consider increasing your activity level by dancing either at home or with a group/class as this is something you enjoy and it is a terrific way to get some exercise.  Expected Outcomes:  Demonstrated interest in learning. Expect positive outcomes  Education material provided: Living Well with Diabetes, A1C conversion sheet, Meal plan card and Carbohydrate counting sheet  If problems or questions, patient to contact team via:  Phone  Future DSME  appointment: PRN

## 2017-11-06 NOTE — Patient Instructions (Signed)
   We discussed what types of foods contain carbohydrate and the value of spreading those types of foods evenly throughout the day. You are already doing this but you now understand why this is important.  Consider obtaining a meter and testing your BG once or twice a week alternately before and after a meal to track how your overall control is going.  Consider increasing your activity level by dancing either at home or with a group/class as this is something you enjoy and it is a terrific way to get some exercise.

## 2018-08-22 ENCOUNTER — Encounter: Payer: BLUE CROSS/BLUE SHIELD | Admitting: Family Medicine

## 2018-08-22 DIAGNOSIS — Z1231 Encounter for screening mammogram for malignant neoplasm of breast: Secondary | ICD-10-CM | POA: Diagnosis not present

## 2018-08-22 DIAGNOSIS — Z130 Encounter for screening for diseases of the blood and blood-forming organs and certain disorders involving the immune mechanism: Secondary | ICD-10-CM | POA: Diagnosis not present

## 2018-08-22 DIAGNOSIS — Z136 Encounter for screening for cardiovascular disorders: Secondary | ICD-10-CM | POA: Diagnosis not present

## 2018-08-22 DIAGNOSIS — Z0001 Encounter for general adult medical examination with abnormal findings: Secondary | ICD-10-CM | POA: Diagnosis not present

## 2018-08-22 DIAGNOSIS — E119 Type 2 diabetes mellitus without complications: Secondary | ICD-10-CM | POA: Diagnosis not present

## 2018-08-26 ENCOUNTER — Ambulatory Visit: Payer: BLUE CROSS/BLUE SHIELD | Admitting: Family Medicine

## 2018-08-26 ENCOUNTER — Encounter: Payer: Self-pay | Admitting: Family Medicine

## 2018-08-26 VITALS — BP 150/70 | HR 79 | Temp 98.6°F | Ht 63.0 in | Wt 179.5 lb

## 2018-08-26 DIAGNOSIS — Z23 Encounter for immunization: Secondary | ICD-10-CM | POA: Diagnosis not present

## 2018-08-26 DIAGNOSIS — E119 Type 2 diabetes mellitus without complications: Secondary | ICD-10-CM | POA: Diagnosis not present

## 2018-08-26 DIAGNOSIS — H40039 Anatomical narrow angle, unspecified eye: Secondary | ICD-10-CM

## 2018-08-26 DIAGNOSIS — E1136 Type 2 diabetes mellitus with diabetic cataract: Secondary | ICD-10-CM | POA: Insufficient documentation

## 2018-08-26 DIAGNOSIS — I10 Essential (primary) hypertension: Secondary | ICD-10-CM

## 2018-08-26 DIAGNOSIS — I160 Hypertensive urgency: Secondary | ICD-10-CM | POA: Insufficient documentation

## 2018-08-26 DIAGNOSIS — R519 Headache, unspecified: Secondary | ICD-10-CM | POA: Insufficient documentation

## 2018-08-26 DIAGNOSIS — R51 Headache: Secondary | ICD-10-CM

## 2018-08-26 MED ORDER — ASPIRIN EC 81 MG PO TBEC: 81.0000 mg | DELAYED_RELEASE_TABLET | Freq: Every day | ORAL | Status: DC

## 2018-08-26 MED ORDER — CLONIDINE HCL 0.1 MG PO TABS
0.1000 mg | ORAL_TABLET | Freq: Once | ORAL | Status: AC
  Administered 2018-08-26: 0.1 mg via ORAL

## 2018-08-26 MED ORDER — ENALAPRIL MALEATE 20 MG PO TABS: 20.0000 mg | ORAL_TABLET | Freq: Every day | ORAL | 3 refills | Status: DC

## 2018-08-26 NOTE — Assessment & Plan Note (Signed)
Mild persistent L occipital headache however more severe last week, associated with vision change, dizziness. Concern for TIA type event last week. Check MRI today. Pt states mild headache did improve after receiving clonidine 0.1mg  in office.

## 2018-08-26 NOTE — Progress Notes (Signed)
BP (!) 150/70 (BP Location: Left Arm, Cuff Size: Normal)   Pulse 79   Temp 98.6 F (37 C) (Oral)   Ht 5\' 3"  (1.6 m)   Wt 179 lb 8 oz (81.4 kg)   SpO2 97%   BMI 31.80 kg/m   BP Readings from Last 3 Encounters:  08/26/18 (!) 150/70  10/25/17 140/76  06/25/17 128/82  On recheck, bp 190/100, after clonidine 172/90  CC: elevated BP  Subjective:    Patient ID: Paige Caldwell, female    DOB: 05/18/1961, 57 y.o.   MRN: [id number]  HPI: Isabel Ardila is a 57 y.o. female presenting on 08/26/2018 for Elevated BP (C/o recent elevated BP readings. Started on 08/21/18. Highest reading was 168/89 on 08/22/18. )   2 wk h/o malaise, last week had severe L sided headache associated with L vision changes (blurry vision and halo around eyes), as well as imbalance and dizziness - this has largely resolved, but L occipital headache persists..   Currently denies malaise or vision changes or dizziness. Mild residual L occipital discomfort.   Was unable to see me last week, told I didn't have opening until Nov. Saw outside provider who prescribed enalapril 5mg  with some benefit - she has been taking BID.   Relevant past medical, surgical, family and social history reviewed and updated as indicated. Interim medical history since our last visit reviewed. Allergies and medications reviewed and updated. Outpatient Medications Prior to Visit  Medication Sig Dispense Refill  . Cholecalciferol (VITAMIN D) 2000 units CAPS Take 1 capsule (2,000 Units total) by mouth daily. 30 capsule   . metFORMIN (GLUCOPHAGE) 500 MG tablet Take 1 tablet (500 mg total) by mouth daily with breakfast. 90 tablet 3   No facility-administered medications prior to visit.     Past Medical History:  Diagnosis Date  . Controlled diabetes mellitus type II without complication (HCC) 03/23/2015  . Fatty liver 2016   by [place]  . Fatty liver disease, nonalcoholic 03/23/2015  . Hyperlipidemia   . Obesity, Class I, BMI 30-34.9 02/21/2015  .  Vitamin D deficiency 03/23/2015    Family History  Problem Relation Age of Onset  . Hyperlipidemia Father        Deceased  . Diabetes Father   . Heart attack Father 36  . Hypertension Father   . Diabetes Sister   . Diabetes Brother   . Breast cancer Neg Hx   . Stroke Neg Hx     Social History   Tobacco Use  . Smoking status: Never Smoker  . Smokeless tobacco: Never Used  Substance Use Topics  . Alcohol use: No    Alcohol/week: 0.0 standard drinks  . Drug use: No    Per HPI unless specifically indicated in ROS section below Review of Systems     Objective:    BP (!) 150/70 (BP Location: Left Arm, Cuff Size: Normal)   Pulse 79   Temp 98.6 F (37 C) (Oral)   Ht 5\' 3"  (1.6 m)   Wt 179 lb 8 oz (81.4 kg)   SpO2 97%   BMI 31.80 kg/m   Wt Readings from Last 3 Encounters:  08/26/18 179 lb 8 oz (81.4 kg)  11/05/17 179 lb (81.2 kg)  10/25/17 179 lb (81.2 kg)    Physical Exam  Constitutional: She appears well-developed and well-nourished. No distress.  HENT:  Head: Normocephalic.  Mouth/Throat: Oropharynx is clear and moist. No oropharyngeal exudate.  Eyes: Pupils are equal, round, and reactive to  light. Conjunctivae and EOM are normal.  EOMI Copper wiring on fundoscopic exam  Neck: Carotid bruit is not present.  Cardiovascular: Normal rate and regular rhythm.  Murmur (2/6 systolic) heard. Pulmonary/Chest: Effort normal and breath sounds normal. No respiratory distress. She has no wheezes. She has no rales.  Musculoskeletal: She exhibits no edema.  Neurological: She is alert. She displays a negative Romberg sign. Coordination and gait normal.  CN 2-12 intact FTN intact EOMI No pronator drift  Nursing note and vitals reviewed.  Results for orders placed or performed in visit on 10/25/17  Hemoglobin A1c  Result Value Ref Range   Hgb A1c MFr Bld 6.6 (H) <5.7 % of total Hgb   Mean Plasma Glucose 143 (calc)   eAG (mmol/L) 7.9 (calc)  Basic metabolic panel  Result  Value Ref Range   Glucose, Bld 108 (H) 65 - 99 mg/dL   BUN 11 7 - 25 mg/dL   Creat 1.61 0.96 - 0.45 mg/dL   BUN/Creatinine Ratio NOT APPLICABLE 6 - 22 (calc)   Sodium 136 135 - 146 mmol/L   Potassium 4.0 3.5 - 5.3 mmol/L   Chloride 101 98 - 110 mmol/L   CO2 27 20 - 32 mmol/L   Calcium 9.8 8.6 - 10.4 mg/dL  Microalbumin / creatinine urine ratio  Result Value Ref Range   Creatinine, Urine 51 20 - 275 mg/dL   Microalb, Ur 0.5 mg/dL   Microalb Creat Ratio 10 <30 mcg/mg creat  Hepatic function panel  Result Value Ref Range   Total Protein 7.8 6.1 - 8.1 g/dL   Albumin 4.2 3.6 - 5.1 g/dL   Globulin 3.6 1.9 - 3.7 g/dL (calc)   AG Ratio 1.2 1.0 - 2.5 (calc)   Total Bilirubin 0.6 0.2 - 1.2 mg/dL   Bilirubin, Direct 0.1 0.0 - 0.2 mg/dL   Indirect Bilirubin 0.5 0.2 - 1.2 mg/dL (calc)   Alkaline phosphatase (APISO) 100 33 - 130 U/L   AST 20 10 - 35 U/L   ALT 22 6 - 29 U/L  VITAMIN D 25 Hydroxy (Vit-D Deficiency, Fractures)  Result Value Ref Range   Vit D, 25-Hydroxy 21 (L) 30 - 100 ng/mL      Assessment & Plan:  Over 40 minutes were spent face-to-face with the patient during this encounter and >50% of that time was spent on counseling and coordination of care  Problem List Items Addressed This Visit    Hypertensive urgency - Primary    With what sounds like hypertensive emergency last week with L sided headache, vision changes, dizziness, r/o CVA/TIA.  Currently BP elevated in office, does improve after clonidine 0.1mg  PO x1.  I recommended she start enalapril 20mg  daily starting tomorrow morning, may take 5mg  dose tonight.  Check labs today. Will order brain MRI eval CVA.  Start aspirin 81mg  daily.  Return 2 wks close f/u.      Relevant Medications   enalapril (VASOTEC) 20 MG tablet   cloNIDine (CATAPRES) tablet 0.1 mg (Completed)   aspirin EC 81 MG tablet   Other Relevant Orders   Basic metabolic panel   TSH   Hypertension    New diagnosis, unclear longevity as pt overdue for  f/u from last visit. Advised to buy BP cuff to start monitoring closely.   Start enalapril 20mg  daily. RTC 2 wks f/u visit - recheck Cr at that time. Check labs today. Will need EKG next visit.       Relevant Medications   enalapril (VASOTEC)  20 MG tablet   cloNIDine (CATAPRES) tablet 0.1 mg (Completed)   aspirin EC 81 MG tablet   Controlled diabetes mellitus type II without complication (HCC)    Overdue for f/u - check A1c today.       Relevant Medications   enalapril (VASOTEC) 20 MG tablet   aspirin EC 81 MG tablet   Other Relevant Orders   Hemoglobin A1c   Vitamin B12   Cataract associated with type 2 diabetes mellitus (HCC)    Noted on exam today.       Relevant Medications   enalapril (VASOTEC) 20 MG tablet   aspirin EC 81 MG tablet   Anatomical narrow angle    Encouraged f/u with Dr Dion Body.      Acute nonintractable headache    Mild persistent L occipital headache however more severe last week, associated with vision change, dizziness. Concern for TIA type event last week. Check MRI today. Pt states mild headache did improve after receiving clonidine 0.1mg  in office.       Relevant Medications   aspirin EC 81 MG tablet   Other Relevant Orders   MR Brain Wo Contrast    Other Visit Diagnoses    Need for influenza vaccination       Relevant Orders   Flu Vaccine QUAD 36+ mos IM (Completed)       Meds ordered this encounter  Medications  . enalapril (VASOTEC) 20 MG tablet    Sig: Take 1 tablet (20 mg total) by mouth daily.    Dispense:  30 tablet    Refill:  3  . cloNIDine (CATAPRES) tablet 0.1 mg  . aspirin EC 81 MG tablet    Sig: Take 1 tablet (81 mg total) by mouth daily.   Orders Placed This Encounter  Procedures  . MR Brain Wo Contrast    Wt 179 /Ht 5'4/no needs/no claus/no metal removed from eyes by a physician/no bullet wound or shrapnel/no sx to brain,heart,eyes or ears/no pacemaker/no defibrillator/no neuro stimulator stents or any other  implants. Ins-BCBS Scheduled with patient KG     Standing Status:   Future    Standing Expiration Date:   10/27/2019    Order Specific Question:   ** REASON FOR EXAM (FREE TEXT)    Answer:   headache with hypertensive urgency, vision changes    Order Specific Question:   What is the patient's sedation requirement?    Answer:   No Sedation    Order Specific Question:   Does the patient have a pacemaker or implanted devices?    Answer:   No    Order Specific Question:   Preferred imaging location?    Answer:   [facility] (table limit-300lbs)    Order Specific Question:   Radiology Contrast Protocol - do NOT remove file path    Answer:   [email or web address]  . Flu Vaccine QUAD 36+ mos IM  . Basic metabolic panel  . TSH  . Hemoglobin A1c  . Vitamin B12    Follow up plan: Return in about 2 weeks (around 09/09/2018) for follow up visit.  Eustaquio Boyden, MD

## 2018-08-26 NOTE — Patient Instructions (Addendum)
Flu shot today Clonidine 0.1mg  today (blood pressure medicine). Empieze enalapril 20mg  diarios en la farmacia. Empieze aspirina 81mg  diarios.  Laboratorios hoy. Vea a [place] hoy para cita para resonancia magnetica cerebral.  Llame para cita con Dr Dion Body para revisar ojos.  Regresar en 2 semanas para proxima visita.

## 2018-08-26 NOTE — Assessment & Plan Note (Addendum)
With what sounds like hypertensive emergency last week with L sided headache, vision changes, dizziness, r/o CVA/TIA.  Currently BP elevated in office, does improve after clonidine 0.1mg  PO x1.  I recommended she start enalapril 20mg  daily starting tomorrow morning, may take 5mg  dose tonight.  Check labs today. Will order brain MRI eval CVA.  Start aspirin 81mg  daily.  Return 2 wks close f/u.

## 2018-08-26 NOTE — Assessment & Plan Note (Signed)
Overdue for f/u - check A1c today.  

## 2018-08-26 NOTE — Assessment & Plan Note (Signed)
Noted on exam today.  

## 2018-08-26 NOTE — Assessment & Plan Note (Signed)
Encouraged f/u with Dr Dion Body.

## 2018-08-26 NOTE — Assessment & Plan Note (Addendum)
New diagnosis, unclear longevity as pt overdue for f/u from last visit. Advised to buy BP cuff to start monitoring closely.   Start enalapril 20mg  daily. RTC 2 wks f/u visit - recheck Cr at that time. Check labs today. Will need EKG next visit.

## 2018-08-27 ENCOUNTER — Other Ambulatory Visit: Payer: Self-pay | Admitting: Family Medicine

## 2018-08-27 LAB — BASIC METABOLIC PANEL
BUN: 15 mg/dL (ref 6–23)
CHLORIDE: 102 meq/L (ref 96–112)
CO2: 29 mEq/L (ref 19–32)
Calcium: 9.8 mg/dL (ref 8.4–10.5)
Creatinine, Ser: 0.72 mg/dL (ref 0.40–1.20)
GFR: 88.34 mL/min (ref >60.00)
Glucose, Bld: 97 mg/dL (ref 70–99)
POTASSIUM: 4.2 meq/L (ref 3.5–5.1)
Sodium: 138 mEq/L (ref 135–145)

## 2018-08-27 LAB — VITAMIN B12: Vitamin B-12: 475 pg/mL (ref 211–911)

## 2018-08-27 LAB — HEMOGLOBIN A1C: HEMOGLOBIN A1C: 7.8 % — AB (ref 4.6–6.5)

## 2018-08-27 LAB — TSH: TSH: 0.9 u[IU]/mL (ref 0.35–4.50)

## 2018-08-27 NOTE — Telephone Encounter (Signed)
Copied from CRM [id number]. Topic: Quick Communication - See Telephone Encounter >> Aug 27, 2018  3:06 PM Terisa Starr wrote: CRM for notification. See Telephone encounter for: 08/27/18.  Pharmacy called and said they received a note from Dr Reece Agar to refill her metFORMIN (GLUCOPHAGE) 500 MG tablet. They need a new script for metFORMIN (GLUCOPHAGE) 500 MG tablet. Please advise.. [facility]

## 2018-08-27 NOTE — Telephone Encounter (Signed)
Pt requesting medication refill. Last RX 08/2016. Last OV 08/27/2018

## 2018-08-29 ENCOUNTER — Encounter: Payer: Self-pay | Admitting: *Deleted

## 2018-08-31 ENCOUNTER — Ambulatory Visit
Admission: RE | Admit: 2018-08-31 | Discharge: 2018-08-31 | Disposition: A | Discharge status: Home / Self Care | Payer: BLUE CROSS/BLUE SHIELD | Source: Ambulatory Visit | Attending: Family Medicine | Admitting: Family Medicine

## 2018-08-31 DIAGNOSIS — R410 Disorientation, unspecified: Secondary | ICD-10-CM | POA: Diagnosis not present

## 2018-08-31 DIAGNOSIS — R51 Headache: Principal | ICD-10-CM

## 2018-08-31 DIAGNOSIS — H538 Other visual disturbances: Secondary | ICD-10-CM | POA: Diagnosis not present

## 2018-08-31 DIAGNOSIS — R519 Headache, unspecified: Secondary | ICD-10-CM

## 2018-08-31 MED ORDER — METFORMIN HCL 500 MG PO TABS: 500.0000 mg | ORAL_TABLET | Freq: Every day | ORAL | 3 refills | Status: DC

## 2018-09-04 DIAGNOSIS — H40033 Anatomical narrow angle, bilateral: Secondary | ICD-10-CM | POA: Diagnosis not present

## 2018-09-04 DIAGNOSIS — E119 Type 2 diabetes mellitus without complications: Secondary | ICD-10-CM | POA: Diagnosis not present

## 2018-09-04 DIAGNOSIS — H524 Presbyopia: Secondary | ICD-10-CM | POA: Diagnosis not present

## 2018-09-04 DIAGNOSIS — H5203 Hypermetropia, bilateral: Secondary | ICD-10-CM | POA: Diagnosis not present

## 2018-09-04 LAB — HM DIABETES EYE EXAM

## 2018-09-10 ENCOUNTER — Encounter: Payer: Self-pay | Admitting: Family Medicine

## 2018-09-10 ENCOUNTER — Ambulatory Visit: Payer: BLUE CROSS/BLUE SHIELD | Admitting: Family Medicine

## 2018-09-10 VITALS — BP 132/80 | HR 80 | Temp 98.4°F | Ht 63.0 in | Wt 177.0 lb

## 2018-09-10 DIAGNOSIS — I10 Essential (primary) hypertension: Secondary | ICD-10-CM | POA: Diagnosis not present

## 2018-09-10 DIAGNOSIS — R519 Headache, unspecified: Secondary | ICD-10-CM

## 2018-09-10 DIAGNOSIS — I160 Hypertensive urgency: Secondary | ICD-10-CM

## 2018-09-10 DIAGNOSIS — E118 Type 2 diabetes mellitus with unspecified complications: Secondary | ICD-10-CM | POA: Diagnosis not present

## 2018-09-10 DIAGNOSIS — R51 Headache: Secondary | ICD-10-CM

## 2018-09-10 DIAGNOSIS — E1136 Type 2 diabetes mellitus with diabetic cataract: Secondary | ICD-10-CM

## 2018-09-10 DIAGNOSIS — E1165 Type 2 diabetes mellitus with hyperglycemia: Secondary | ICD-10-CM

## 2018-09-10 DIAGNOSIS — IMO0002 Reserved for concepts with insufficient information to code with codable children: Secondary | ICD-10-CM

## 2018-09-10 DIAGNOSIS — H40039 Anatomical narrow angle, unspecified eye: Secondary | ICD-10-CM

## 2018-09-10 MED ORDER — METFORMIN HCL 500 MG PO TABS: 500.0000 mg | ORAL_TABLET | Freq: Two times a day (BID) | ORAL | 3 refills | Status: DC

## 2018-09-10 MED ORDER — ENALAPRIL MALEATE 20 MG PO TABS: 20.0000 mg | ORAL_TABLET | Freq: Every day | ORAL | 3 refills | Status: DC

## 2018-09-10 NOTE — Assessment & Plan Note (Signed)
This has resolved.

## 2018-09-10 NOTE — Progress Notes (Signed)
BP 132/80 (BP Location: Left Arm, Patient Position: Sitting, Cuff Size: Normal)   Pulse 80   Temp 98.4 F (36.9 C) (Oral)   Ht 5\' 3"  (1.6 m)   Wt 177 lb (80.3 kg)   SpO2 97%   BMI 31.35 kg/m    CC: 2 wk f/u visit Subjective:    Patient ID: Paige Caldwell, female    DOB: 08-01-1961, 57 y.o.   MRN: [id number]  HPI: Paige Caldwell is a 57 y.o. female presenting on 09/10/2018 for Hypertension (Here for 2 wk HTN urgency f/u.)   See prior note for details.  Hypertensive urgency s/p outside provider evaluation last month, treated with enalapril 5mg . We increased to 20mg  daily which she has tolerated well.  Recent labwork reassuring as well as recent MRI, reviewed with patient. She states home BP readings running 130-140 systolic.    DM - does not regularly check sugars. Compliant with antihyperglycemic regimen which includes: metformin 500mg  daily. Denies low sugars or hypoglycemic symptoms. Denies paresthesias. Last diabetic eye exam last week. Pneumovax: 06/2017. Prevnar: not due. Glucometer brand: to check with insurance. DSME: completed at [facility] 10/2017. Lab Results  Component Value Date   HGBA1C 7.8 (H) 08/26/2018   Diabetic Foot Exam - Simple   No data filed     Lab Results  Component Value Date   MICROALBUR 0.5 10/25/2017     Relevant past medical, surgical, family and social history reviewed and updated as indicated. Interim medical history since our last visit reviewed. Allergies and medications reviewed and updated. Outpatient Medications Prior to Visit  Medication Sig Dispense Refill  . aspirin EC 81 MG tablet Take 1 tablet (81 mg total) by mouth daily.    . Cholecalciferol (VITAMIN D) 2000 units CAPS Take 1 capsule (2,000 Units total) by mouth daily. 30 capsule   . enalapril (VASOTEC) 20 MG tablet Take 1 tablet (20 mg total) by mouth daily. 30 tablet 3  . metFORMIN (GLUCOPHAGE) 500 MG tablet Take 1 tablet (500 mg total) by mouth daily with breakfast. 90 tablet 3   No  facility-administered medications prior to visit.      Per HPI unless specifically indicated in ROS section below Review of Systems     Objective:    BP 132/80 (BP Location: Left Arm, Patient Position: Sitting, Cuff Size: Normal)   Pulse 80   Temp 98.4 F (36.9 C) (Oral)   Ht 5\' 3"  (1.6 m)   Wt 177 lb (80.3 kg)   SpO2 97%   BMI 31.35 kg/m   Wt Readings from Last 3 Encounters:  09/10/18 177 lb (80.3 kg)  08/26/18 179 lb 8 oz (81.4 kg)  11/05/17 179 lb (81.2 kg)    Physical Exam  Constitutional: She appears well-developed and well-nourished. No distress.  HENT:  Mouth/Throat: Oropharynx is clear and moist. No oropharyngeal exudate.  Cardiovascular: Normal rate, regular rhythm and normal heart sounds.  No murmur heard. Pulmonary/Chest: Effort normal and breath sounds normal. No respiratory distress. She has no wheezes. She has no rales.  Musculoskeletal: She exhibits no edema.  Nursing note and vitals reviewed.  Results for orders placed or performed in visit on 09/10/18  HM DIABETES EYE EXAM  Result Value Ref Range   HM Diabetic Eye Exam No Retinopathy No Retinopathy      Assessment & Plan:   Problem List Items Addressed This Visit    Hypertensive urgency    This has resolved.      Relevant Medications  enalapril (VASOTEC) 20 MG tablet   Hypertension - Primary    Chronic, improved on benazepril 20mg  daily - will continue this. I asked her to monitor BP at home, let me know if consistently elevated. She has new bp cuff at home.  RTC 3 mo f/u visit. Will need EKG next visit.       Relevant Medications   enalapril (VASOTEC) 20 MG tablet   Other Relevant Orders   Basic metabolic panel   Diabetes mellitus type 2, uncontrolled, with complications (HCC)    Reviewed latest A1c - pt attributes to increased stress and has not been following diabetic diet or regular exercise routine. Motivated to make healthy changes. Will increase metformin to 500mg  bid for now, RTC 3  mo DM f/u visit. Will need foot exam at that visit.       Relevant Medications   enalapril (VASOTEC) 20 MG tablet   metFORMIN (GLUCOPHAGE) 500 MG tablet   Cataract associated with type 2 diabetes mellitus (HCC)   Relevant Medications   enalapril (VASOTEC) 20 MG tablet   metFORMIN (GLUCOPHAGE) 500 MG tablet   Anatomical narrow angle    Reviewed latest note from Dr Dion Body - recommending surgical intervention - referred to Dr Wynelle Link.       RESOLVED: Acute nonintractable headache    This has resolved.           Meds ordered this encounter  Medications  . enalapril (VASOTEC) 20 MG tablet    Sig: Take 1 tablet (20 mg total) by mouth daily.    Dispense:  90 tablet    Refill:  3  . metFORMIN (GLUCOPHAGE) 500 MG tablet    Sig: Take 1 tablet (500 mg total) by mouth 2 (two) times daily with a meal.    Dispense:  180 tablet    Refill:  3   Orders Placed This Encounter  Procedures  . Basic metabolic panel  . HM DIABETES EYE EXAM    This external order was created through the Results Console.    Follow up plan: Return in about 3 months (around 12/11/2018) for follow up visit.  Eustaquio Boyden, MD

## 2018-09-10 NOTE — Assessment & Plan Note (Addendum)
Reviewed latest A1c - pt attributes to increased stress and has not been following diabetic diet or regular exercise routine. Motivated to make healthy changes. Will increase metformin to 500mg  bid for now, RTC 3 mo DM f/u visit. Will need foot exam at that visit.

## 2018-09-10 NOTE — Assessment & Plan Note (Signed)
Reviewed latest note from Dr Dion Body - recommending surgical intervention - referred to Dr Wynelle Link.

## 2018-09-10 NOTE — Patient Instructions (Addendum)
Revisaremos riones hoy despues de subir doses de benazepril.  Revise marca preferida para su seguro medico de glucometro (one-touch, accu-check, relion).  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  Meta ante de comida = 80-120.  Meta 2 horas despues de comida = 100-180.  Paige Caldwell dosis de metformina a 500mg  dos veces al dia con comidas.  Regresar en 3 meses para seguimiento de presion arterial y diabetes.

## 2018-09-10 NOTE — Assessment & Plan Note (Addendum)
Chronic, improved on benazepril 20mg  daily - will continue this. I asked her to monitor BP at home, let me know if consistently elevated. She has new bp cuff at home.  RTC 3 mo f/u visit. Will need EKG next visit.

## 2018-09-11 LAB — BASIC METABOLIC PANEL
BUN: 15 mg/dL (ref 6–23)
CALCIUM: 9.6 mg/dL (ref 8.4–10.5)
CO2: 28 mEq/L (ref 19–32)
Chloride: 101 mEq/L (ref 96–112)
Creatinine, Ser: 1.01 mg/dL (ref 0.40–1.20)
GFR: 59.77 mL/min — AB (ref >60.00)
GLUCOSE: 81 mg/dL (ref 70–99)
POTASSIUM: 4.3 meq/L (ref 3.5–5.1)
SODIUM: 138 meq/L (ref 135–145)

## 2018-09-19 DIAGNOSIS — H25013 Cortical age-related cataract, bilateral: Secondary | ICD-10-CM | POA: Diagnosis not present

## 2018-09-19 DIAGNOSIS — H40033 Anatomical narrow angle, bilateral: Secondary | ICD-10-CM | POA: Diagnosis not present

## 2018-09-19 DIAGNOSIS — H25043 Posterior subcapsular polar age-related cataract, bilateral: Secondary | ICD-10-CM | POA: Diagnosis not present

## 2018-09-19 DIAGNOSIS — H2513 Age-related nuclear cataract, bilateral: Secondary | ICD-10-CM | POA: Diagnosis not present

## 2018-10-07 ENCOUNTER — Other Ambulatory Visit: Payer: Self-pay | Admitting: Family Medicine

## 2018-10-07 DIAGNOSIS — Z1231 Encounter for screening mammogram for malignant neoplasm of breast: Secondary | ICD-10-CM

## 2018-12-02 ENCOUNTER — Ambulatory Visit
Admission: RE | Admit: 2018-12-02 | Discharge: 2018-12-02 | Disposition: A | Discharge status: Home / Self Care | Payer: BLUE CROSS/BLUE SHIELD | Source: Ambulatory Visit | Attending: Family Medicine | Admitting: Family Medicine

## 2018-12-02 DIAGNOSIS — Z1231 Encounter for screening mammogram for malignant neoplasm of breast: Secondary | ICD-10-CM | POA: Diagnosis not present

## 2018-12-04 ENCOUNTER — Encounter: Payer: Self-pay | Admitting: *Deleted

## 2018-12-10 ENCOUNTER — Encounter: Payer: Self-pay | Admitting: Family Medicine

## 2018-12-10 ENCOUNTER — Ambulatory Visit: Payer: BLUE CROSS/BLUE SHIELD | Admitting: Family Medicine

## 2018-12-10 VITALS — BP 138/86 | HR 81 | Temp 98.4°F | Ht 63.0 in | Wt 176.5 lb

## 2018-12-10 DIAGNOSIS — I10 Essential (primary) hypertension: Secondary | ICD-10-CM | POA: Diagnosis not present

## 2018-12-10 DIAGNOSIS — E118 Type 2 diabetes mellitus with unspecified complications: Secondary | ICD-10-CM | POA: Diagnosis not present

## 2018-12-10 DIAGNOSIS — E782 Mixed hyperlipidemia: Secondary | ICD-10-CM

## 2018-12-10 DIAGNOSIS — E1165 Type 2 diabetes mellitus with hyperglycemia: Secondary | ICD-10-CM

## 2018-12-10 DIAGNOSIS — K760 Fatty (change of) liver, not elsewhere classified: Secondary | ICD-10-CM

## 2018-12-10 DIAGNOSIS — E1136 Type 2 diabetes mellitus with diabetic cataract: Secondary | ICD-10-CM

## 2018-12-10 DIAGNOSIS — IMO0002 Reserved for concepts with insufficient information to code with codable children: Secondary | ICD-10-CM

## 2018-12-10 LAB — POCT GLYCOSYLATED HEMOGLOBIN (HGB A1C): Hemoglobin A1C: 6.2 % — AB (ref 4.0–5.6)

## 2018-12-10 NOTE — Patient Instructions (Addendum)
Gusto verla hoy.  Diabetes esta muy bien controlada hoy! Felicidades! Electrocardiograma hoy.  Regresar en 4 meses para proxima visita.  Laboratorio hoy para revisar rion de nuevo.

## 2018-12-10 NOTE — Assessment & Plan Note (Signed)
Chronic, significant improvement noted with increased metformin dose and pt being more cognisant of diabetic diet. Continue healthy diet choices, metformin BID.

## 2018-12-10 NOTE — Assessment & Plan Note (Signed)
Chronic, stable. Continue current regimen of enalapril 20mg  daily. She did have bump in Cr since starting this - will recheck today. Baseline EKG today.

## 2018-12-10 NOTE — Assessment & Plan Note (Signed)
Update dLDL 

## 2018-12-10 NOTE — Assessment & Plan Note (Signed)
Has established with eye doctor. Has been recommended cataract surgery.

## 2018-12-10 NOTE — Progress Notes (Signed)
BP 138/86 (BP Location: Left Arm, Patient Position: Sitting, Cuff Size: Normal)   Pulse 81   Temp 98.4 F (36.9 C) (Oral)   Ht 5\' 3"  (1.6 m)   Wt 176 lb 8 oz (80.1 kg)   SpO2 98%   BMI 31.27 kg/m    CC: 3 mo f/u visit Subjective:    Patient ID: Paige Caldwell, female    DOB: 12/16/1960, 58 y.o.   MRN: [id number]  HPI: Paige Caldwell is a 58 y.o. female presenting on 12/10/2018 for Follow-up (Here for 3 mo f/u.)   Anatomical narrow angle - sees Dr Dion Body and Dr Wynelle Link - who recommended cataract surgery. No mention of glaucoma.   HTN - Compliant with current antihypertensive regimen of enalapril 20mg  daily.  Does check blood pressures at home: 130-140/85. No low blood pressure readings or symptoms of dizziness/syncope. Denies HA, vision changes, CP/tightness, SOB, leg swelling.   DM - does regularly check sugars and brings meter - fasting (110s), postprandial 160s. Compliant with antihyperglycemic regimen which includes: metformin 500mg  bid. Still likes tortillas (makes her own corn tortillas). Denies low sugars or hypoglycemic symptoms. Denies paresthesias. Last diabetic eye exam 08/2018. Pneumovax: 06/2017. Prevnar: not due. Glucometer brand: relion. DSME: 10/2017 at [facility]. Lab Results  Component Value Date   HGBA1C 6.2 (A) 12/10/2018   Diabetic Foot Exam - Simple   Simple Foot Form Diabetic Foot exam was performed with the following findings:  Yes 12/10/2018  5:17 PM  Visual Inspection No deformities, no ulcerations, no other skin breakdown bilaterally:  Yes Sensation Testing Intact to touch and monofilament testing bilaterally:  Yes Pulse Check Posterior Tibialis and Dorsalis pulse intact bilaterally:  Yes Comments    Lab Results  Component Value Date   MICROALBUR 0.5 10/25/2017        Relevant past medical, surgical, family and social history reviewed and updated as indicated. Interim medical history since our last visit reviewed. Allergies and  medications reviewed and updated. Outpatient Medications Prior to Visit  Medication Sig Dispense Refill  . aspirin EC 81 MG tablet Take 1 tablet (81 mg total) by mouth daily.    . Cholecalciferol (VITAMIN D) 2000 units CAPS Take 1 capsule (2,000 Units total) by mouth daily. 30 capsule   . enalapril (VASOTEC) 20 MG tablet Take 1 tablet (20 mg total) by mouth daily. 90 tablet 3  . metFORMIN (GLUCOPHAGE) 500 MG tablet Take 1 tablet (500 mg total) by mouth 2 (two) times daily with a meal. 180 tablet 3   No facility-administered medications prior to visit.      Per HPI unless specifically indicated in ROS section below Review of Systems Objective:    BP 138/86 (BP Location: Left Arm, Patient Position: Sitting, Cuff Size: Normal)   Pulse 81   Temp 98.4 F (36.9 C) (Oral)   Ht 5\' 3"  (1.6 m)   Wt 176 lb 8 oz (80.1 kg)   SpO2 98%   BMI 31.27 kg/m   Wt Readings from Last 3 Encounters:  12/10/18 176 lb 8 oz (80.1 kg)  09/10/18 177 lb (80.3 kg)  08/26/18 179 lb 8 oz (81.4 kg)    Physical Exam Vitals signs and nursing note reviewed.  Constitutional:      General: She is not in acute distress.    Appearance: She is well-developed.  HENT:     Head: Normocephalic and atraumatic.     Right Ear: External ear normal.     Left Ear: External ear  normal.     Nose: Nose normal.     Mouth/Throat:     Pharynx: No oropharyngeal exudate.  Eyes:     General: No scleral icterus.    Conjunctiva/sclera: Conjunctivae normal.     Pupils: Pupils are equal, round, and reactive to light.     Comments: Bilateral cataracts  Neck:     Musculoskeletal: Normal range of motion and neck supple.  Cardiovascular:     Rate and Rhythm: Normal rate and regular rhythm.     Heart sounds: Normal heart sounds. No murmur.  Pulmonary:     Effort: Pulmonary effort is normal. No respiratory distress.     Breath sounds: Normal breath sounds. No wheezing or rales.  Musculoskeletal:     Comments: See HPI for foot exam  if done  Lymphadenopathy:     Cervical: No cervical adenopathy.  Skin:    General: Skin is warm and dry.     Findings: No rash.       Results for orders placed or performed in visit on 12/10/18  POCT glycosylated hemoglobin (Hb A1C)  Result Value Ref Range   Hemoglobin A1C 6.2 (A) 4.0 - 5.6 %   HbA1c POC (<> result, manual entry)     HbA1c, POC (prediabetic range)     HbA1c, POC (controlled diabetic range)     Lab Results  Component Value Date   CREATININE 1.01 09/10/2018   BUN 15 09/10/2018   NA 138 09/10/2018   K 4.3 09/10/2018   CL 101 09/10/2018   CO2 28 09/10/2018    Lab Results  Component Value Date   CHOL 247 (H) 06/21/2017   HDL 49.10 06/21/2017   LDLCALC 165 (H) 06/21/2017   LDLDIRECT 220.0 05/25/2016   TRIG 164.0 (H) 06/21/2017   CHOLHDL 5 06/21/2017    Lab Results  Component Value Date   ALT 22 10/25/2017   AST 20 10/25/2017   ALKPHOS 71 06/28/2015   BILITOT 0.6 10/25/2017    EKG -  Assessment & Plan:   Problem List Items Addressed This Visit    Hypertension    Chronic, stable. Continue current regimen of enalapril 20mg  daily. She did have bump in Cr since starting this - will recheck today. Baseline EKG today.       Relevant Orders   EKG 12-Lead (Completed)   Comprehensive metabolic panel   Hyperlipidemia    Update dLDL      Relevant Orders   LDL Cholesterol, Direct   Fatty liver disease, nonalcoholic    Check lfts.       Relevant Orders   Comprehensive metabolic panel   Diabetes mellitus type 2, uncontrolled, with complications (HCC) - Primary    Chronic, significant improvement noted with increased metformin dose and pt being more cognisant of diabetic diet. Continue healthy diet choices, metformin BID.       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 Has been recommended cataract  surgery.          No orders of the defined types were placed in this encounter.  Orders Placed This Encounter  Procedures  . LDL Cholesterol, Direct  . Comprehensive metabolic panel  . POCT glycosylated hemoglobin (Hb A1C)  . EKG 12-Lead    Follow up plan: Return in about 4 months (around 04/10/2019) for follow up visit.  Eustaquio Boyden,  MD

## 2018-12-10 NOTE — Assessment & Plan Note (Signed)
Check lfts 

## 2018-12-11 ENCOUNTER — Other Ambulatory Visit: Payer: Self-pay | Admitting: Family Medicine

## 2018-12-11 DIAGNOSIS — IMO0002 Reserved for concepts with insufficient information to code with codable children: Secondary | ICD-10-CM

## 2018-12-11 DIAGNOSIS — E1165 Type 2 diabetes mellitus with hyperglycemia: Secondary | ICD-10-CM

## 2018-12-11 DIAGNOSIS — E118 Type 2 diabetes mellitus with unspecified complications: Secondary | ICD-10-CM

## 2018-12-11 DIAGNOSIS — I160 Hypertensive urgency: Secondary | ICD-10-CM

## 2018-12-11 DIAGNOSIS — I447 Left bundle-branch block, unspecified: Secondary | ICD-10-CM

## 2018-12-11 DIAGNOSIS — I10 Essential (primary) hypertension: Secondary | ICD-10-CM

## 2018-12-11 LAB — COMPREHENSIVE METABOLIC PANEL
ALT: 21 U/L (ref 0–35)
AST: 21 U/L (ref 0–37)
Albumin: 4.3 g/dL (ref 3.5–5.2)
Alkaline Phosphatase: 81 U/L (ref 39–117)
BUN: 13 mg/dL (ref 6–23)
CO2: 28 meq/L (ref 19–32)
Calcium: 9.7 mg/dL (ref 8.4–10.5)
Chloride: 100 mEq/L (ref 96–112)
Creatinine, Ser: 0.83 mg/dL (ref 0.40–1.20)
GFR: 70.47 mL/min (ref >60.00)
GLUCOSE: 99 mg/dL (ref 70–99)
POTASSIUM: 4 meq/L (ref 3.5–5.1)
Sodium: 136 mEq/L (ref 135–145)
Total Bilirubin: 0.5 mg/dL (ref 0.2–1.2)
Total Protein: 7.9 g/dL (ref 6.0–8.3)

## 2018-12-11 LAB — LDL CHOLESTEROL, DIRECT: LDL DIRECT: 182 mg/dL

## 2019-01-02 ENCOUNTER — Ambulatory Visit: Payer: BLUE CROSS/BLUE SHIELD | Admitting: Cardiovascular Disease

## 2019-01-02 ENCOUNTER — Encounter: Payer: Self-pay | Admitting: Cardiovascular Disease

## 2019-01-02 VITALS — BP 114/72 | HR 73 | Ht 64.0 in | Wt 179.0 lb

## 2019-01-02 DIAGNOSIS — E785 Hyperlipidemia, unspecified: Secondary | ICD-10-CM

## 2019-01-02 DIAGNOSIS — Z8249 Family history of ischemic heart disease and other diseases of the circulatory system: Secondary | ICD-10-CM | POA: Insufficient documentation

## 2019-01-02 DIAGNOSIS — I447 Left bundle-branch block, unspecified: Secondary | ICD-10-CM | POA: Insufficient documentation

## 2019-01-02 NOTE — Assessment & Plan Note (Signed)
Chronic. 

## 2019-01-02 NOTE — Patient Instructions (Signed)
Medication Instructions:  Your physician recommends that you continue on your current medications as directed. Please refer to the Current Medication list given to you today.  If you need a refill on your cardiac medications before your next appointment, please call your pharmacy.   Lab work: NONE If you have labs (blood work) drawn today and your tests are completely normal, you will receive your results only by: Marland Kitchen MyChart Message (if you have MyChart) OR . A paper copy in the mail If you have any lab test that is abnormal or we need to change your treatment, we will call you to review the results.  Testing/Procedures: Your physician recommends the following:  Coronary Calcium Scan A coronary calcium scan is an imaging test used to look for deposits of calcium and other fatty materials (plaques) in the inner lining of the blood vessels of the heart (coronary arteries). These deposits of calcium and plaques can partly clog and narrow the coronary arteries without producing any symptoms or warning signs. This puts a person at risk for a heart attack. This test can detect these deposits before symptoms develop. Tell a health care provider about:  Any allergies you have.  All medicines you are taking, including vitamins, herbs, eye drops, creams, and over-the-counter medicines.  Any problems you or family members have had with anesthetic medicines.  Any blood disorders you have.  Any surgeries you have had.  Any medical conditions you have.  Whether you are pregnant or may be pregnant. What are the risks? Generally, this is a safe procedure. However, problems may occur, including:  Harm to a pregnant woman and her unborn baby. This test involves the use of radiation. Radiation exposure can be dangerous to a pregnant woman and her unborn baby. If you are pregnant, you generally should not have this procedure done.  Slight increase in the risk of cancer. This is because of the  radiation involved in the test. What happens before the procedure? No preparation is needed for this procedure. What happens during the procedure?   You will undress and remove any jewelry around your neck or chest.  You will put on a hospital gown.  Sticky electrodes will be placed on your chest. The electrodes will be connected to an electrocardiogram (ECG) machine to record a tracing of the electrical activity of your heart.  A CT scanner will take pictures of your heart. During this time, you will be asked to lie still and hold your breath for 2-3 seconds while a picture of your heart is being taken. The procedure may vary among health care providers and hospitals. What happens after the procedure?  You can get dressed.  You can return to your normal activities.  It is up to you to get the results of your test. Ask your health care provider, or the department that is doing the test, when your results will be ready. Summary  A coronary calcium scan is an imaging test used to look for deposits of calcium and other fatty materials (plaques) in the inner lining of the blood vessels of the heart (coronary arteries).  Generally, this is a safe procedure. Tell your health care provider if you are pregnant or may be pregnant.  No preparation is needed for this procedure.  A CT scanner will take pictures of your heart.  You can return to your normal activities after the scan is done. This information is not intended to replace advice given to you by your health care  provider. Make sure you discuss any questions you have with your health care provider. Document Released: 05/03/2008 Document Revised: 09/24/2016 Document Reviewed: 09/24/2016 Elsevier Interactive Patient Education  2019 [employer].   Follow-Up: At [facility], you and your health needs are our priority.  As part of our continuing mission to provide you with exceptional heart care, we have created designated  Provider Care Teams.  These Care Teams include your primary Cardiologist (physician) and Advanced Practice Providers (APPs -  Physician Assistants and Nurse Practitioners) who all work together to provide you with the care you need, when you need it. . You may schedule a follow up appointment as needed. You may see Dr. Allyson Sabal or one of the following Advanced Practice Providers on your designated Care Team:   . Corine Shelter, [place] . Azalee Course, PA-C . Micah Flesher, PA-C . Joni Reining, DNP . Theodore Demark, PA-C . Judy Pimple, PA-C . Marjie Skiff, PA-C
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
## 2019-01-02 NOTE — Assessment & Plan Note (Signed)
History of essential hypertension with blood pressure measured today at 114/72.  She is on enalapril.Paige Caldwell

## 2019-01-02 NOTE — Assessment & Plan Note (Signed)
History of hyperlipidemia intolerant to statin therapy with lipid profile performed 06/21/2017 revealing total cholesterol 247, LDL 182 and HDL 49.

## 2019-01-02 NOTE — Assessment & Plan Note (Signed)
Father died at age 59 of a myocardial infarction.

## 2019-01-22 ENCOUNTER — Ambulatory Visit (INDEPENDENT_AMBULATORY_CARE_PROVIDER_SITE_OTHER)
Admission: RE | Admit: 2019-01-22 | Discharge: 2019-01-22 | Disposition: A | Discharge status: Home / Self Care | Payer: Self-pay | Source: Ambulatory Visit | Attending: Cardiovascular Disease | Admitting: Cardiovascular Disease

## 2019-01-22 DIAGNOSIS — E785 Hyperlipidemia, unspecified: Secondary | ICD-10-CM

## 2019-01-22 DIAGNOSIS — Z8249 Family history of ischemic heart disease and other diseases of the circulatory system: Secondary | ICD-10-CM

## 2019-04-15 ENCOUNTER — Ambulatory Visit: Payer: BLUE CROSS/BLUE SHIELD | Admitting: Family Medicine

## 2019-08-26 ENCOUNTER — Other Ambulatory Visit: Payer: Self-pay | Admitting: Family Medicine

## 2019-08-26 DIAGNOSIS — Z1231 Encounter for screening mammogram for malignant neoplasm of breast: Secondary | ICD-10-CM

## 2019-08-29 IMAGING — MR MR HEAD W/O CM
11 series · 48 of 48 positions shown · non-contrast
Comparison: None.

CLINICAL DATA: 57-year-old female with 3 weeks of left side
headache, blurred vision in the left eye. Nausea, confusion,
weakness.

EXAM:
MRI HEAD WITHOUT CONTRAST
TECHNIQUE: Multiplanar, multiecho pulse sequences of the brain and surrounding
structures were obtained without intravenous contrast.

[Series 2: T1 · sagittal · 5.0mm · 0.45mm/px · 1 of 26 slices shown]
[im 1/26]
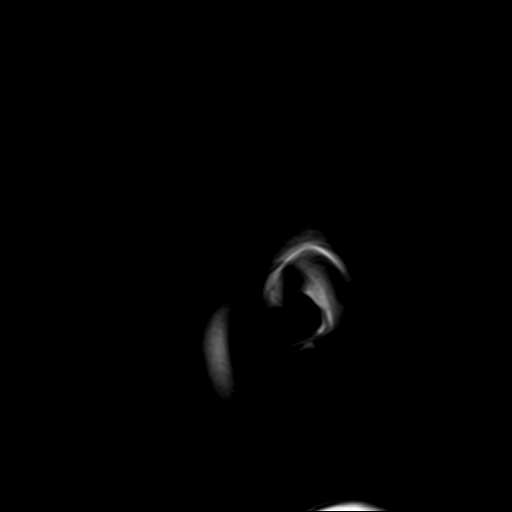

[Series 3: DWI · axial · 3.0mm · 1.80mm/px · z∈[-89,+68]mm · 9 of 108 slices shown (1 of 4)]
[im 1/108]
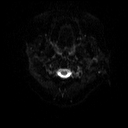
[im 14/108]
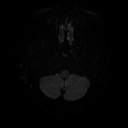
[im 27/108]
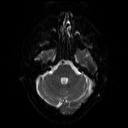
[im 41/108]
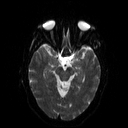
[im 54/108]
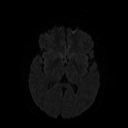
[im 67/108]
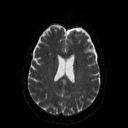
[im 81/108]
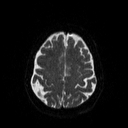
[im 94/108]
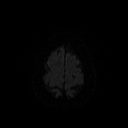
[im 108/108]
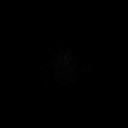

[Series 4: DWI · axial · 3.0mm · 1.80mm/px · z∈[-89,+68]mm · 4 of 52 slices shown (2 of 4)]
[im 1/52]
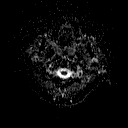
[im 18/52]
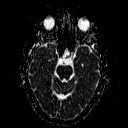
[im 35/52]
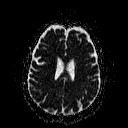
[im 52/52]
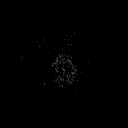

[Series 5: FLAIR · axial · 3.0mm · 0.45mm/px · z∈[-88,+67]mm · 3 of 35 slices shown]
[im 1/35]
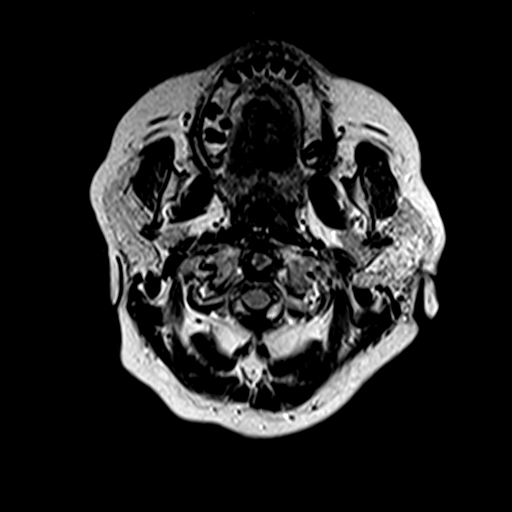
[im 18/35]
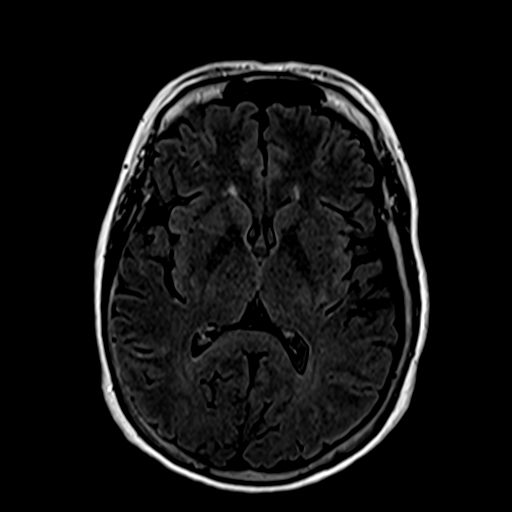
[im 35/35]
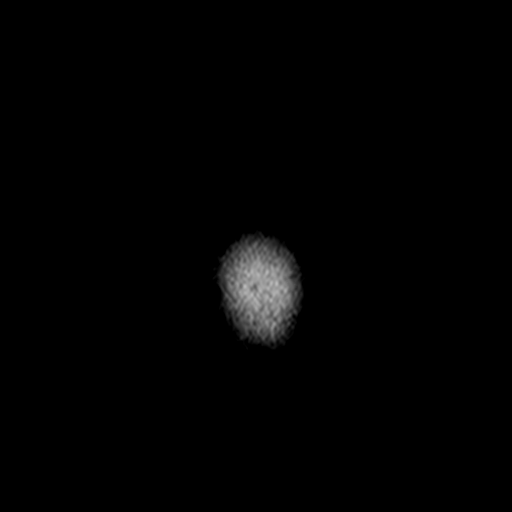

[Series 6: T2 · axial · 5.0mm · 0.60mm/px · z∈[-89,+70]mm · 2 of 25 slices shown (1 of 2)]
[im 1/25]
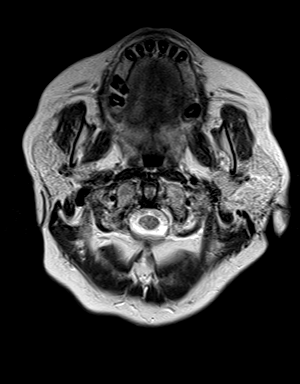
[im 25/25]
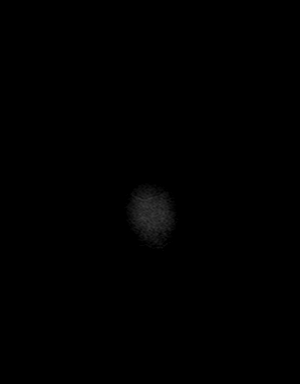

[Series 7: mip_images(sw) · axial · 40.0mm · 0.90mm/px · z∈[-63,+55]mm · 2 of 25 slices shown]
[im 1/25]
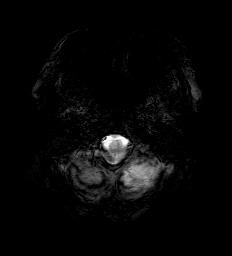
[im 25/25]
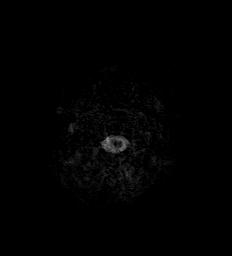

[Series 8: swi_images · axial · 5.0mm · 0.90mm/px · z∈[-81,+72]mm · 3 of 32 slices shown]
[im 1/32]
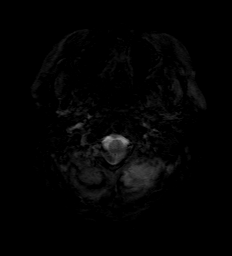
[im 16/32]
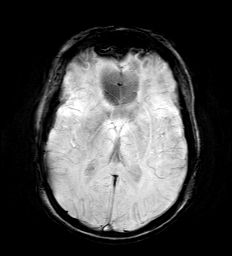
[im 32/32]
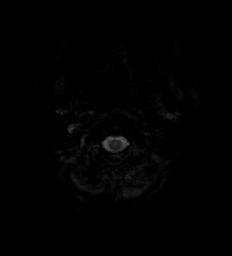

[Series 9: t1_mpr_tra · axial · 1.0mm · 0.72mm/px · z∈[-88,+69]mm · 13 of 160 slices shown]
[im 1/160]
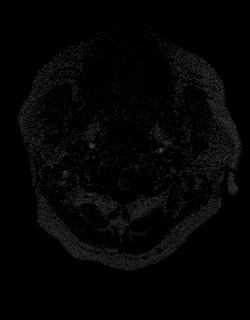
[im 14/160]
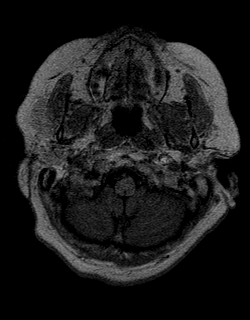
[im 27/160]
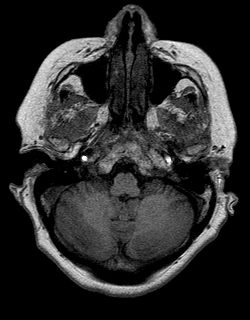
[im 40/160]
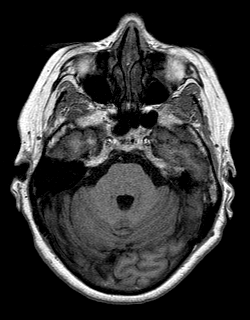
[im 54/160]
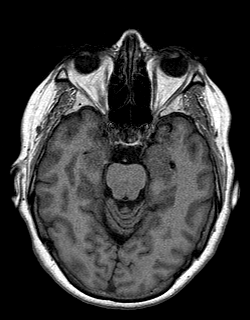
[im 67/160]
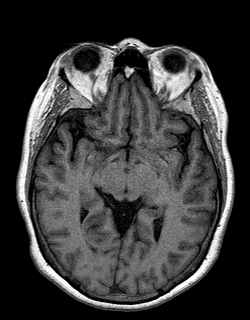
[im 80/160]
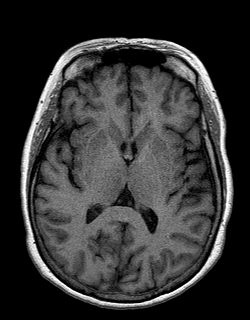
[im 93/160]
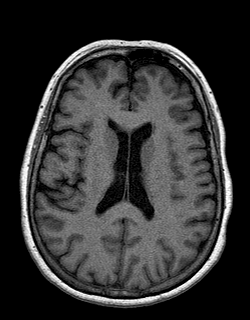
[im 107/160]
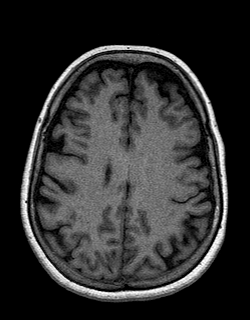
[im 120/160]
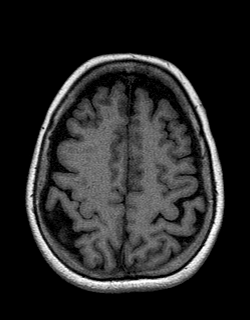
[im 133/160]
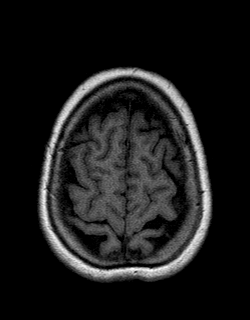
[im 146/160]
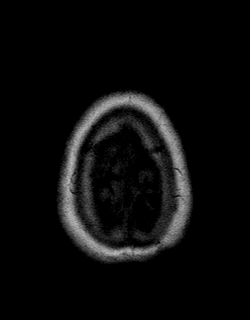
[im 160/160]
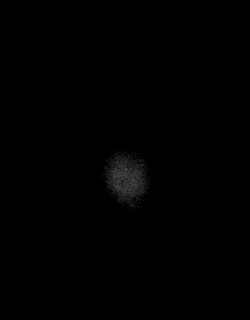

[Series 10: DWI · coronal · 5.0mm · 1.80mm/px · 6 of 76 slices shown (3 of 4)]
[im 1/76]
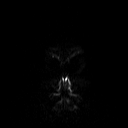
[im 16/76]
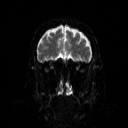
[im 31/76]
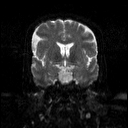
[im 46/76]
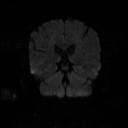
[im 61/76]
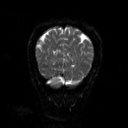
[im 76/76]
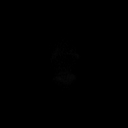

[Series 11: DWI · coronal · 5.0mm · 1.80mm/px · 3 of 39 slices shown (4 of 4)]
[im 1/39]
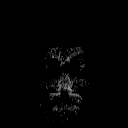
[im 20/39]
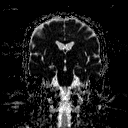
[im 39/39]
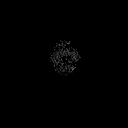

[Series 12: T2 · coronal · 5.0mm · 0.45mm/px · 2 of 30 slices shown (2 of 2)]
[im 1/30]
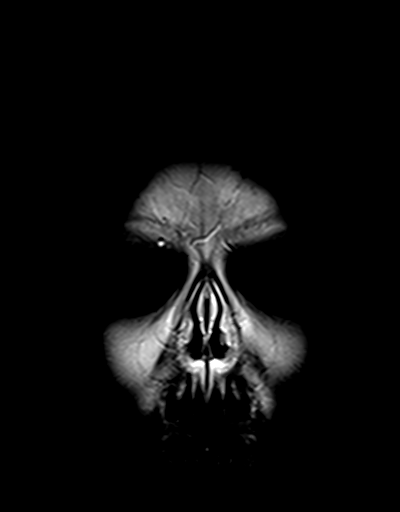
[im 30/30]
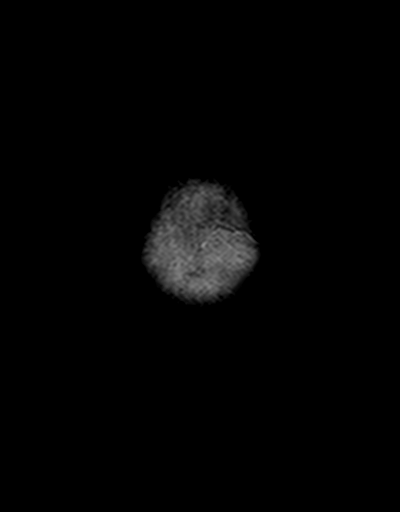

[48 of 48 positions shown; findings below may reference images not displayed]

FINDINGS: Brain: Cerebral volume is within normal limits. No restricted
diffusion to suggest acute infarction. No midline shift, mass
effect, evidence of mass lesion, ventriculomegaly, extra-axial
collection or acute intracranial hemorrhage. Cervicomedullary
junction and pituitary are within normal limits.

Largely normal for age gray and white matter signal throughout the
brain. There are a few scattered small nonspecific cerebral white
matter foci which are mostly subcortical. No chronic cerebral blood
products or cortical encephalomalacia. Deep gray matter nuclei,
brainstem, and cerebellum appear normal.

Vascular: Major intracranial vascular flow voids are preserved.

Skull and upper cervical spine: Normal visible cervical spine.
Visualized bone marrow signal is within normal limits.

Sinuses/Orbits: Normal orbits soft tissues. Paranasal sinuses and
mastoids are well pneumatized.

Other: Grossly normal visible internal auditory structures. Scalp
and face soft tissues appear negative.
IMPRESSION: No acute intracranial abnormality and essentially normal for age
noncontrast MRI appearance of the brain.

## 2019-08-31 ENCOUNTER — Other Ambulatory Visit: Payer: Self-pay

## 2019-08-31 ENCOUNTER — Ambulatory Visit
Admission: RE | Admit: 2019-08-31 | Discharge: 2019-08-31 | Disposition: A | Discharge status: Home / Self Care | Payer: BC Managed Care – PPO | Source: Ambulatory Visit | Attending: Family Medicine | Admitting: Family Medicine

## 2019-08-31 DIAGNOSIS — Z1231 Encounter for screening mammogram for malignant neoplasm of breast: Secondary | ICD-10-CM

## 2019-10-13 ENCOUNTER — Ambulatory Visit (INDEPENDENT_AMBULATORY_CARE_PROVIDER_SITE_OTHER): Payer: BC Managed Care – PPO | Admitting: Family Medicine

## 2019-10-13 ENCOUNTER — Other Ambulatory Visit: Payer: Self-pay

## 2019-10-13 ENCOUNTER — Encounter: Payer: Self-pay | Admitting: Family Medicine

## 2019-10-13 ENCOUNTER — Other Ambulatory Visit (HOSPITAL_COMMUNITY)
Admission: RE | Admit: 2019-10-13 | Discharge: 2019-10-13 | Disposition: A | Discharge status: Home / Self Care | Payer: BC Managed Care – PPO | Source: Ambulatory Visit | Attending: Family Medicine | Admitting: Family Medicine

## 2019-10-13 VITALS — BP 162/90 | HR 85 | Temp 98.1°F | Ht 63.0 in | Wt 177.1 lb

## 2019-10-13 DIAGNOSIS — E1136 Type 2 diabetes mellitus with diabetic cataract: Secondary | ICD-10-CM | POA: Diagnosis not present

## 2019-10-13 DIAGNOSIS — E1169 Type 2 diabetes mellitus with other specified complication: Secondary | ICD-10-CM

## 2019-10-13 DIAGNOSIS — E785 Hyperlipidemia, unspecified: Secondary | ICD-10-CM

## 2019-10-13 DIAGNOSIS — E118 Type 2 diabetes mellitus with unspecified complications: Secondary | ICD-10-CM

## 2019-10-13 DIAGNOSIS — Z01419 Encounter for gynecological examination (general) (routine) without abnormal findings: Secondary | ICD-10-CM | POA: Insufficient documentation

## 2019-10-13 DIAGNOSIS — E559 Vitamin D deficiency, unspecified: Secondary | ICD-10-CM

## 2019-10-13 DIAGNOSIS — Z0000 Encounter for general adult medical examination without abnormal findings: Secondary | ICD-10-CM | POA: Diagnosis not present

## 2019-10-13 DIAGNOSIS — E1159 Type 2 diabetes mellitus with other circulatory complications: Secondary | ICD-10-CM

## 2019-10-13 DIAGNOSIS — E669 Obesity, unspecified: Secondary | ICD-10-CM

## 2019-10-13 DIAGNOSIS — I152 Hypertension secondary to endocrine disorders: Secondary | ICD-10-CM

## 2019-10-13 DIAGNOSIS — I10 Essential (primary) hypertension: Secondary | ICD-10-CM

## 2019-10-13 DIAGNOSIS — E66811 Obesity, class 1: Secondary | ICD-10-CM

## 2019-10-13 MED ORDER — ENALAPRIL MALEATE 20 MG PO TABS: 20.0000 mg | ORAL_TABLET | Freq: Two times a day (BID) | ORAL | 3 refills | Status: DC

## 2019-10-13 MED ORDER — METFORMIN HCL 500 MG PO TABS: 500.0000 mg | ORAL_TABLET | Freq: Two times a day (BID) | ORAL | 3 refills | Status: DC
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
## 2019-10-13 NOTE — Assessment & Plan Note (Signed)
Chronic, uncontrolled. Will increase enalapril to 20mg  bid. RTC 4-6 wks f/u visit.

## 2019-10-13 NOTE — Assessment & Plan Note (Signed)
Mild.

## 2019-10-13 NOTE — Assessment & Plan Note (Signed)
Chronic, stable. Continue current regimen. Update labs today.

## 2019-10-13 NOTE — Patient Instructions (Addendum)
Labs today Suba dosis de enalapril a 20mg  dos veces al dia. Siga metformina dos veces al dia.  La mandaremos a gastroenterologo para repetir colonoscopia.  Haga cita cuando pueda para examen de vision.  Papanicolau hoy.  Regresar en 4-6 semanas para chequeo de presion arterial.  Gusto verla hoy. regresar en 1 ao para proximo examen fisico.   [place] Maintenance for Postmenopausal Women La menopausia es un proceso normal en el cual la capacidad de quedar embarazada llega a su fin. Este proceso ocurre lentamente a lo largo de un perodo de muchos meses o aos; por lo general, entre los 5 y los 3aos. La menopausia es completa cuando no se ha tenido el perodo menstrual por 61meses. Es importante hablar con el mdico sobre algunas de las enfermedades ms comunes que afectan a las mujeres despus de la menopausia (mujeres posmenopusicas). Estas incluyen la enfermedad cardaca, el cncer y la prdida sea (osteoporosis). Adoptar un estilo de vida saludable y recibir atencin preventiva pueden ayudar a promover la salud y Musician. Las medidas que tome tambin pueden reducir las probabilidades de Actor algunas de estas enfermedades frecuentes. Qu debo saber acerca de la menopausia? Durante la menopausia, puede tener una serie de sntomas, por ejemplo:  Acaloramiento. Estos pueden ser moderados o intensos.  Sudoracin nocturna.  Disminucin del deseo sexual.  Cambios en el estado de nimo.  Dolores de [place].  Cansancio.  Irritabilidad.  Problemas de memoria.  Insomnio. Tratar o no estos sntomas es una decisin que se toma con el mdico. Necesito terapia de reemplazo hormonal?  La terapia de reemplazo hormonal es eficaz para tratar los sntomas causados por la menopausia, como los acaloramientos y las sudoraciones nocturnas.  La reposicin hormonal conlleva ciertos riesgos, especialmente a medida que una mujer  envejece. Si est pensando en usar estrgeno o estrgeno con progestina, analice los beneficios y los riesgos con el mdico. Cul es mi riesgo de sufrir enfermedad cardaca y accidente cerebrovascular? A medida que se envejece, aumenta el riesgo de enfermedad cardaca, infarto de miocardio y accidente cerebrovascular. Una de las causas puede ser un cambio en las hormonas del cuerpo durante la menopausia. Esto puede afectar la forma en que el organismo procesa las [place], los triglicridos y el colesterol de su dieta. El infarto de miocardio y el accidente cerebrovascular son emergencias mdicas. Hay muchas cosas que se pueden hacer para ayudar a prevenir la enfermedad cardaca y el accidente cerebrovascular. Contrlese la presin arterial  La hipertensin arterial causa enfermedades cardacas y [place] el riesgo de accidente cerebrovascular. Es ms probable que esto se manifieste en las personas que tienen lecturas de presin arterial alta, tienen ascendencia africana o tienen sobrepeso.  Hgase controlar la presin arterial: ? Cada 3 a 5 aos si tiene entre 18 y 33 aos. ? Todos los aos si es mayor de [place]. Consuma una dieta saludable   Consuma una dieta que incluya muchas verduras, frutas, productos lcteos con bajo contenido de [place] y Advertising account planner.  No consuma muchos alimentos ricos en grasas slidas, azcares agregados o sodio. Haga ejercicio con regularidad Haga ejercicio con regularidad. Esta es una de las prcticas ms importantes que puede hacer por su salud. La mayora de los adultos deben seguir estas pautas:  Intente realizar al menos 130minutos de actividad fsica por semana. El ejercicio debe aumentar la frecuencia cardaca y Nature conservation officer transpirar (ejercicio de intensidad moderada).  Intente hacer ejercicios de elongacin por lo [employer] por  semana. Agrguelos al plan de ejercicio de intensidad moderada.  Pasar menos tiempo sentados. Incluso la actividad fsica  ligera puede ser beneficiosa. Otros consejos  Trabaje con su mdico para Barista o Pharmacologist un peso saludable.  No consuma ningn producto que contenga nicotina o tabaco, como cigarrillos, cigarrillos electrnicos y tabaco de Theatre manager. Si necesita ayuda para dejar de fumar, consulte al mdico.  Conozca sus cifras. Pdale al mdico que le controle el colesterol y el nivel sanguneo de azcar en la sangre (glucosa). Siga hacindose anlisis de [employer] se lo haya indicado el mdico. Necesito realizarme pruebas de deteccin del cncer? Segn su historia clnica y sus antecedentes familiares, es posible que deba realizarse pruebas de deteccin del cncer en diferentes etapas de la vida. Esto puede incluir pruebas de deteccin de lo siguiente:  Cncer de mama.  Cncer de cuello uterino.  Cncer de pulmn.  Cncer colorrectal. Cul es mi riesgo de tener osteoporosis? Despus de la menopausia, puede correr un riesgo ms alto de tener osteoporosis. La osteoporosis es una afeccin en la cual la destruccin de la masa sea ocurre con mayor rapidez que su formacin. Para ayudar a prevenir esta afeccin o las fracturas seas que pueden ocurrir a causa de [employer], usted puede tomar las siguientes medidas:  Si tiene entre 19 y 50aos, tome como mnimo 1000mg  de calcio y 600mg  de vitaminaD por .  Si es mayor de 50aos pero menor de 70aos, tome como mnimo 1200mg  de calcio y 600mg  de vitaminaD por .  Si es mayor de 70aos, tome como mnimo 1200mg  de calcio y 800mg  de vitaminaD por . Fumar y beber alcohol en exceso aumentan el riesgo de osteoporosis. Consuma alimentos ricos en calcio y vitaminaD, y haga ejercicios con soporte de peso varias veces a la semana, como se lo haya indicado el mdico. De qu manera la menopausia afecta mi salud mental? La depresin puede presentarse a cualquier edad, pero es ms frecuente a medida que una persona envejece. Los sntomas comunes de  depresin incluyen lo siguiente:  Desnimo o tristeza.  Cambios en los patrones de sueo.  Cambios en el apetito o en los hbitos de alimentacin.  Sensacin de falta general de motivacin o placer al actividades que sola disfrutar.  Crisis frecuentes de llanto. Hable con el mdico si cree que tiene depresin. Instrucciones generales Visite a su mdico para hacerse exmenes de bienestar peridicos y aplicarse vacunas. Puede incluir:  Programar exmenes peridicos dentales, de la salud y de Futures trader.  Recibir y 09-27-1970. Estos incluyen los siguientes: ? . Aplquese esta vacuna todos los aos antes de que comience la temporada de gripe. ? Vacuna contra la neumona. ? Vacuna contra el herpes. ? Vacuna contra el ttanos, la difteria y la tos (Tdap). El mdico tambin puede recomendarle que se aplique otras vacunas. Notifique a su mdico si alguna vez ha sido vctima de abuso o si no se siente seguro en su hogar. Resumen  La menopausia es un proceso normal en el cual la capacidad de quedar embarazada llega a su fin.  Esta condicin causa acaloramientos, sudoraciones nocturnas, disminucin del inters en el sexo, cambios en el estado de nimo, dolores de Futures trader o falta de sueo.  El tratamiento de esta afeccin puede incluir una terapia de reemplazo hormonal.  Tome medidas para mantenerse sana, entre ellas, hacer ejercicio con regularidad, seguir una dieta saludable, controlar su peso y medirse la presin arterial y [place] de  azcar en la sangre.  Hgase pruebas para Engineer, sitedetectar cncer y depresin. Asegrese de estar al da con todas las vacunas. Esta informacin no tiene Theme park managercomo fin reemplazar el consejo del mdico. Asegrese de hacerle al mdico cualquier pregunta que tenga. Document Released: 08/26/2013 Document Revised: 11/26/2018 Document Reviewed: 11/26/2018 Elsevier Patient Education  2020 [employer]

## 2019-10-13 NOTE — Assessment & Plan Note (Signed)
Chronic, off statin. Update labs.  The 10-year ASCVD risk score Mikey Bussing DC Brooke Bonito., et al., 2013) is: 15.2%   Values used to calculate the score:     Age: 58 years     Sex: Female     Is Non-Hispanic African American: No     Diabetic: Yes     Tobacco smoker: No     Systolic Blood Pressure: 734 mmHg     Is BP treated: Yes     HDL Cholesterol: 49.1 mg/dL     Total Cholesterol: 247 mg/dL

## 2019-10-13 NOTE — Assessment & Plan Note (Signed)
Encouraged healthy diet and lifestyle changes to affect sustainable weight loss.  

## 2019-10-13 NOTE — Assessment & Plan Note (Signed)
Preventative protocols reviewed and updated unless pt declined. Discussed healthy diet and lifestyle.  

## 2019-10-13 NOTE — Assessment & Plan Note (Signed)
Continue vit D replacement.  

## 2019-10-14 LAB — COMPREHENSIVE METABOLIC PANEL
ALT: 23 U/L (ref 0–35)
AST: 20 U/L (ref 0–37)
Albumin: 4 g/dL (ref 3.5–5.2)
Alkaline Phosphatase: 90 U/L (ref 39–117)
BUN: 15 mg/dL (ref 6–23)
CO2: 25 mEq/L (ref 19–32)
Calcium: 9.6 mg/dL (ref 8.4–10.5)
Chloride: 101 mEq/L (ref 96–112)
Creatinine, Ser: 0.6 mg/dL (ref 0.40–1.20)
GFR: 102.18 mL/min (ref >60.00)
Glucose, Bld: 91 mg/dL (ref 70–99)
Potassium: 4.5 mEq/L (ref 3.5–5.1)
Sodium: 137 mEq/L (ref 135–145)
Total Bilirubin: 0.9 mg/dL (ref 0.2–1.2)
Total Protein: 7.4 g/dL (ref 6.0–8.3)

## 2019-10-14 LAB — LIPID PANEL
Cholesterol: 318 mg/dL — ABNORMAL HIGH (ref 0–200)
HDL: 54.2 mg/dL (ref >39.00)
NonHDL: 263.81
Total CHOL/HDL Ratio: 6
Triglycerides: 322 mg/dL — ABNORMAL HIGH (ref 0.0–149.0)
VLDL: 64.4 mg/dL — ABNORMAL HIGH (ref 0.0–40.0)

## 2019-10-14 LAB — HEMOGLOBIN A1C: Hgb A1c MFr Bld: 7.5 % — ABNORMAL HIGH (ref 4.6–6.5)

## 2019-10-14 LAB — VITAMIN D 25 HYDROXY (VIT D DEFICIENCY, FRACTURES): VITD: 26.33 ng/mL — ABNORMAL LOW (ref 30.00–100.00)

## 2019-10-14 LAB — LDL CHOLESTEROL, DIRECT: Direct LDL: 205 mg/dL

## 2019-10-14 LAB — TSH: TSH: 0.82 u[IU]/mL (ref 0.35–4.50)

## 2019-10-16 LAB — CYTOLOGY - PAP
Comment: NEGATIVE
Diagnosis: NEGATIVE
High risk HPV: NEGATIVE

## 2019-10-17 ENCOUNTER — Other Ambulatory Visit: Payer: Self-pay | Admitting: Family Medicine

## 2019-10-17 MED ORDER — ATORVASTATIN CALCIUM 40 MG PO TABS: 40.0000 mg | ORAL_TABLET | Freq: Every day | ORAL | 6 refills | Status: DC

## 2019-11-24 ENCOUNTER — Other Ambulatory Visit: Payer: Self-pay

## 2019-11-24 ENCOUNTER — Encounter: Payer: Self-pay | Admitting: Family Medicine

## 2019-11-24 ENCOUNTER — Ambulatory Visit: Payer: BLUE CROSS/BLUE SHIELD | Admitting: Family Medicine

## 2019-11-24 VITALS — BP 158/88 | HR 83 | Temp 96.2°F | Ht 63.0 in | Wt 177.9 lb

## 2019-11-24 DIAGNOSIS — E559 Vitamin D deficiency, unspecified: Secondary | ICD-10-CM | POA: Diagnosis not present

## 2019-11-24 DIAGNOSIS — I10 Essential (primary) hypertension: Secondary | ICD-10-CM

## 2019-11-24 DIAGNOSIS — E1159 Type 2 diabetes mellitus with other circulatory complications: Secondary | ICD-10-CM | POA: Diagnosis not present

## 2019-11-24 DIAGNOSIS — E785 Hyperlipidemia, unspecified: Secondary | ICD-10-CM

## 2019-11-24 DIAGNOSIS — E669 Obesity, unspecified: Secondary | ICD-10-CM

## 2019-11-24 DIAGNOSIS — I152 Hypertension secondary to endocrine disorders: Secondary | ICD-10-CM

## 2019-11-24 DIAGNOSIS — E1169 Type 2 diabetes mellitus with other specified complication: Secondary | ICD-10-CM | POA: Diagnosis not present

## 2019-11-24 MED ORDER — VITAMIN D3 125 MCG (5000 UT) PO TABS: 1.0000 | ORAL_TABLET | Freq: Every day | ORAL | Status: DC

## 2019-11-24 MED ORDER — ROSUVASTATIN CALCIUM 5 MG PO TABS: 5.0000 mg | ORAL_TABLET | Freq: Every day | ORAL | 3 refills | Status: DC

## 2019-11-24 NOTE — Assessment & Plan Note (Signed)
Encouraged weight loss as a way to help control diabetes, hypertension.

## 2019-11-24 NOTE — Progress Notes (Signed)
This visit was conducted in person.  BP (!) 158/88 (BP Location: Left Arm, Patient Position: Sitting, Cuff Size: Large)   Pulse 83   Temp (!) 96.2 F (35.7 C) (Temporal)   Ht 5\' 3"  (1.6 m)   Wt 177 lb 14.4 oz (80.7 kg)   SpO2 97%   BMI 31.51 kg/m   Elevated on repeat.   CC: HTN f/u visit Subjective:    Patient ID: Paige Caldwell, female    DOB: July 04, 1961, 59 y.o.   MRN: [id number]  HPI: Paige Caldwell is a 59 y.o. female presenting on 11/24/2019 for Hypertension (Pt states that her BP range at home has been 130s-140s/80s-90s)   HTN - Compliant with current antihypertensive regimen of enalapril 20mg  bid.  Does check blood pressures at home: 130-140/80-90. bp elevated today - attributes to recently getting off work (high stress job 01/22/2020 - considering finding new job). No low blood pressure readings or symptoms of dizziness/syncope. Denies HA, vision changes, CP/tightness, SOB, leg swelling.   HLD - due to elevated LDL in diabetic, we started atorvastatin 40mg  daily last visit. Not tolerating med well due to nausea and myalgias. She stopped last month.   Vit D def - she has increased to 5000 IU daily.      Relevant past medical, surgical, family and social history reviewed and updated as indicated. Interim medical history since our last visit reviewed. Allergies and medications reviewed and updated. Outpatient Medications Prior to Visit  Medication Sig Dispense Refill  . aspirin EC 81 MG tablet Take 1 tablet (81 mg total) by mouth daily.    . enalapril (VASOTEC) 20 MG tablet Take 1 tablet (20 mg total) by mouth 2 (two) times daily. 180 tablet 3  . metFORMIN (GLUCOPHAGE) 500 MG tablet Take 1 tablet (500 mg total) by mouth 2 (two) times daily with a meal. 180 tablet 3  . atorvastatin (LIPITOR) 40 MG tablet Take 1 tablet (40 mg total) by mouth daily. 30 tablet 6  . Cholecalciferol (VITAMIN D) 2000 units CAPS Take 1 capsule (2,000 Units total) by mouth daily.  30 capsule    No facility-administered medications prior to visit.     Per HPI unless specifically indicated in ROS section below Review of Systems Objective:    BP (!) 158/88 (BP Location: Left Arm, Patient Position: Sitting, Cuff Size: Large)   Pulse 83   Temp (!) 96.2 F (35.7 C) (Temporal)   Ht 5\' 3"  (1.6 m)   Wt 177 lb 14.4 oz (80.7 kg)   SpO2 97%   BMI 31.51 kg/m   Wt Readings from Last 3 Encounters:  11/24/19 177 lb 14.4 oz (80.7 kg)  10/13/19 177 lb 1 oz (80.3 kg)  01/02/19 179 lb (81.2 kg)    Physical Exam Vitals and nursing note reviewed.  Constitutional:      Appearance: Normal appearance. She is not ill-appearing.  Eyes:     Extraocular Movements: Extraocular movements intact.     Pupils: Pupils are equal, round, and reactive to light.  Cardiovascular:     Rate and Rhythm: Normal rate and regular rhythm.     Pulses: Normal pulses.     Heart sounds: Normal heart sounds. No murmur.  Pulmonary:     Effort: Pulmonary effort is normal. No respiratory distress.     Breath sounds: Normal breath sounds. No wheezing, rhonchi or rales.  Musculoskeletal:     Right lower leg: No edema.     Left lower leg: No  edema.  Neurological:     Mental Status: She is alert.  Psychiatric:        Mood and Affect: Mood normal.        Behavior: Behavior normal.       Results for orders placed or performed in visit on 10/13/19  Lipid panel  Result Value Ref Range   Cholesterol 318 (H) 0 - 200 mg/dL   Triglycerides 322.0 (H) 0.0 - 149.0 mg/dL   HDL 54.20 >39.00 mg/dL   VLDL 64.4 (H) 0.0 - 40.0 mg/dL   Total CHOL/HDL Ratio 6    NonHDL 263.81   Comprehensive metabolic panel  Result Value Ref Range   Sodium 137 135 - 145 mEq/L   Potassium 4.5 3.5 - 5.1 mEq/L   Chloride 101 96 - 112 mEq/L   CO2 25 19 - 32 mEq/L   Glucose, Bld 91 70 - 99 mg/dL   BUN 15 6 - 23 mg/dL   Creatinine, Ser 0.60 0.40 - 1.20 mg/dL   Total Bilirubin 0.9 0.2 - 1.2 mg/dL   Alkaline Phosphatase 90 39 -  117 U/L   AST 20 0 - 37 U/L   ALT 23 0 - 35 U/L   Total Protein 7.4 6.0 - 8.3 g/dL   Albumin 4.0 3.5 - 5.2 g/dL   GFR 102.18 >60.00 mL/min   Calcium 9.6 8.4 - 10.5 mg/dL  TSH  Result Value Ref Range   TSH 0.82 0.35 - 4.50 uIU/mL  Hemoglobin A1c  Result Value Ref Range   Hgb A1c MFr Bld 7.5 (H) 4.6 - 6.5 %  vit d  Result Value Ref Range   VITD 26.33 (L) 30.00 - 100.00 ng/mL  LDL cholesterol, direct  Result Value Ref Range   Direct LDL 205.0 mg/dL  Cytology - PAP  Result Value Ref Range   High risk HPV Negative    Adequacy      Satisfactory for evaluation; transformation zone component PRESENT.   Diagnosis      - Negative for intraepithelial lesion or malignancy (NILM)   Comment Normal Reference Range HPV - Negative    Assessment & Plan:  This visit occurred during the SARS-CoV-2 public health emergency.  Safety protocols were in place, including screening questions prior to the visit, additional usage of staff PPE, and extensive cleaning of exam room while observing appropriate contact time as indicated for disinfecting solutions.  Recent labs reviewed with patient. Problem List Items Addressed This Visit    Vitamin D deficiency    Tolerating higher vit D dose (5000 IU daily)      Obesity, Class I, BMI 30-34.9    Encouraged weight loss as a way to help control diabetes, hypertension.       Hypertension associated with diabetes ([place]) - Primary    Improving based on home readings. Today's BP elevated however pt attributes to recent end of stressful work day. No changes made today.  Update Cr with recent increase in enalapril 09/2019.       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 Will trial crestor, if not tolerated discussed decreased frequency dosing,  if not tolerated consider lovastatin and referral to lipid clinic (discussed possible PCSK9 inhibitor option). Pt agrees with plan.  Handout provided today.  Relevant Medications   rosuvastatin (CRESTOR) 5 MG tablet       Meds ordered this encounter  Medications  . rosuvastatin (CRESTOR) 5 MG tablet    Sig: Take 1 tablet (5 mg total) by mouth daily.    Dispense:  30 tablet    Refill:  3    In place of atorvastatin  . Cholecalciferol (VITAMIN D3) 125 MCG (5000 UT) TABS    Sig: Take 1 tablet (5,000 Units total) by mouth daily.   Orders Placed This Encounter  Procedures  . Basic metabolic panel    Patient instructions: Presion esta mejor con numeros en casa. Trate nueva medicina para colesterol crestor 5mg  diarios, si no lo tolera baje frequencia de . si no tolera medicina, dejeme saber.  Gusto verla hoy.  Regresar en 4 meses para proxima visita.   Follow up plan: Return in about 4 months (around 03/23/2020) for follow up visit.  05/23/2020, MD

## 2019-11-24 NOTE — Assessment & Plan Note (Addendum)
Improving based on home readings. Today's BP elevated however pt attributes to recent end of stressful work day. No changes made today.  Update Cr with recent increase in enalapril 09/2019.

## 2019-11-24 NOTE — Assessment & Plan Note (Signed)
Tolerating higher vit D dose (5000 IU daily)

## 2019-11-24 NOTE — Assessment & Plan Note (Signed)
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 Will trial crestor, if not tolerated discussed decreased frequency dosing, if not tolerated consider lovastatin and referral to lipid clinic (discussed possible PCSK9 inhibitor option). Pt agrees with plan.  Handout provided today.

## 2019-11-24 NOTE — Patient Instructions (Addendum)
Presion esta mejor con numeros en casa. Trate nueva medicina para colesterol crestor 5mg  diarios, si no lo tolera baje frequencia de [place]. si no tolera medicina, dejeme saber.  Gusto verla hoy.  Regresar en 4 meses para proxima visita.   Hipercolesterolemia familiar Familial Hypercholesterolemia La hipercolesterolemia familiar (HF) es un trastorno gentico que causa un nivel muy alto de colesterol LDL (lipoprotenas de baja densidad). El colesterol es una sustancia cerosa parecida a la grasa que el organismo necesita para generar clulas. El cuerpo produce todo el colesterol que necesita en el hgado y elimina el colesterol que sobra (el exceso) de la sangre segn sea necesario. El exceso de colesterol proviene de los alimentos que comemos. En las Doctor, hospital, el cuerpo no puede eliminar el colesterol LDL de la sangre como debera [place]. Un alto nivel de colesterol LDL causa mayor riesgo de que las arterias se Teacher, music y Advertising account executive (aterosclerosis) a una edad temprana. Esto aumenta el riesgo de enfermedad cardaca y accidente cerebrovascular. Cules son las causas? La hipercolesterolemia familiar se transmite de padres a hijos (es hereditaria). La hipercolesterolemia familiar es causada por el defecto de un gen hereditario (mutacin gentica) que provoca que el hgado tenga dificultad para eliminar el colesterol LDL de la [place]. Ese gen puede heredarse de uno de los padres o de [place]. Qu incrementa el riesgo? Una persona puede tener un riesgo ms alto de hipercolesterolemia familiar si:  Tiene antecedentes familiares de la afeccin. Si ambos padres portan la mutacin gentica, sus hijos corren mayor riesgo de tener una forma ms grave de hipercolesterolemia familiar, con sntomas que comienzan a una edad ms temprana. Cules son los signos o los sntomas? Es posible tener un alto nivel de colesterol LDL antes de desarrollar sntomas. Entre los sntomas  de hipercolesterolemia familiar pueden incluirse los siguientes:  Ndulos de colesterol (xantomas) en los cordones de tejido que [employer] msculos con los huesos (tendones). Los xantomas suelen formarse en el tendn largo de la parte posterior del tobillo (el tendn de [place]) o en los tendones de la parte posterior de las manos.  Depsitos de colesterol (xantelasmas) debajo de la piel de los prpados.  Un aro grisceo o azul alrededor de la parte blanca del ojo (arco corneal). La hipercolesterolemia familiar puede tener complicaciones debido a la aterosclerosis. La aterosclerosis puede daar una zona del cuerpo que no recibe suficiente sangre. Las complicaciones de la hipercolesterolemia familiar pueden ser las siguientes:  Dolor de pecho (angina) y dificultad para respirar debido al estrechamiento o la obstruccin de las arterias del corazn (arteriopata coronaria).  Dolor y Ecologist parte posterior inferior de las piernas (las pantorrillas) al caminar (claudicacin).  Interrupcin del flujo sanguneo al cerebro (accidente cerebrovascular). Esto puede causar lo siguiente: ? Prdida del equilibrio. ? Prdida de la visin. ? Debilidad o adormecimiento repentinos en un lado del cuerpo. Cmo se diagnostica? Esta afeccin se puede diagnosticar en funcin de lo siguiente:  Sus sntomas.  Sus antecedentes mdicos, incluidos los antecedentes familiares de hipercolesterolemia familiar o enfermedad cardaca coronaria temprana.  Un examen fsico.  Un anlisis de sangre para detectar la mutacin gentica que causa la hipercolesterolemia familiar. Los familiares tambin pueden ser evaluados. Cmo se trata? La hipercolesterolemia familiar no tiene [place], pero el tratamiento puede disminuir los niveles de colesterol LDL y reducir el riesgo de infarto de miocardio o accidente cerebrovascular. El tratamiento se debe iniciar tan pronto como se recibe el diagnstico. El tratamiento puede  incluir lo siguiente:  Candida Peeling  tipo de medicamento que reduce el colesterol (estatina). Si las estatinas no son eficaces, el mdico puede probar otros tipos de medicamentos para Nurse, mental health. La combinacin exacta de medicamentos depende de la gravedad de los sntomas.  Un procedimiento para filtrar el colesterol LDL de la sangre (afresis). Este tratamiento puede ser necesario cuando se tiene una forma grave de Biomedical scientist.  Realizar cambios en el estilo de vida que sean saludables para el corazn, como reducir la cantidad de grasa y colesterol que se consume en la dieta. Siga estas indicaciones en su casa: Estilo de vida   Pierda peso si se lo indica su mdico.  Siga las indicaciones del mdico acerca de consumir alimentos saludables. El mdico puede recomendarle lo siguiente: ? Printmaker con un especialista en alimentacin (nutricionista), quien puede ayudarle a crear un plan de alimentacin saludable y a Pharmacologist un peso saludable. ? Consumir menos alimentos con grasa y colesterol. Evitar las carnes grasas, los alimentos fritos y los lcteos enteros. ? Comer ms verduras, frutas y cereales integrales. ? Reducir la ingesta de alcohol.  Haga actividad fsica. Pregntele al mdico qu tipo de ejercicio es el ms adecuado para usted.  No consuma ningn producto que contenga nicotina o tabaco, como cigarrillos y Administrator, Civil Service. Si necesita ayuda para dejar de fumar, consulte al mdico.  Trabaje con el mdico para controlar otras afecciones que tenga, como presin arterial alta (hipertensin) o diabetes. Estas enfermedades afectan el corazn. Instrucciones generales  [employer] de venta libre y los recetados solamente como se lo haya indicado el mdico.  Oceanographer a todas las visitas de control como se lo haya indicado el mdico. Esto es importante. Comunquese con un mdico si:  Tiene dolor o calambres en la pantorrilla cuando camina. Solicite  ayuda de inmediato si:   Tiene molestias repentinas e inexplicables en el pecho, los brazos, la espalda, el cuello, la [place] o la parte superior del cuerpo.  Tiene dificultad para respirar.  Siente sbitamente un dolor de cabeza intenso que no tiene causa aparente.  Tiene sntomas de un accidente cerebrovascular. "BE FAST" es una manera fcil de recordar las principales seales de advertencia de un accidente cerebrovascular: ? B - Balance (equilibrio). Los signos son dificultad repentina para caminar o prdida del equilibrio. ? E - Eyes (ojos). Los signos son dificultad para ver o un cambio repentino en la visin. ? F - Face (rostro). Los signos son debilidad repentina o entumecimiento del rostro, o el rostro o el prpado que se caen hacia un lado. ? A - Arms (brazos). Los signos son debilidad o entumecimiento en un brazo. Esto sucede de repente y generalmente en un lado del cuerpo. ? S - Speech (habla). Los signos son dificultad para hablar, hablar arrastrando las palabras o dificultad para comprender lo que la gente dice. ? T - Time (tiempo). Es tiempo de Freight forwarder a los servicios de Sports administrator. Escriba la hora en la que comenzaron los sntomas. Estos sntomas pueden representar un problema grave que constituye Radio broadcast assistant. No espere hasta que los sntomas desaparezcan. Solicite atencin mdica de inmediato. Comunquese con el servicio de emergencias de su localidad (911 en los [place]). No conduzca por sus propios medios [employer] hospital. Resumen  La hipercolesterolemia familiar (HF) es un trastorno gentico que causa un nivel muy alto de colesterol LDL (lipoprotenas de baja densidad).  La hipercolesterolemia familiar aumenta el riesgo de enfermedad cardaca coronaria y accidente cerebrovascular a una edad temprana.  El tratamiento de la hipercolesterolemia  familiar debe iniciarse tan pronto como se reciba el diagnstico de la afeccin. La finalidad del tratamiento es  disminuir el riesgo de complicaciones.  Siga las indicaciones del mdico acerca de consumir alimentos saludables. El mdico puede recomendarle que consuma menos alimentos con grasa y colesterol. Esta informacin no tiene Theme park manager el consejo del mdico. Asegrese de hacerle al mdico cualquier pregunta que tenga. Document Revised: 09/02/2017 Document Reviewed: 07/09/2017 Elsevier Patient Education  2020 [employer].

## 2019-11-25 LAB — BASIC METABOLIC PANEL
BUN: 12 mg/dL (ref 6–23)
CO2: 28 mEq/L (ref 19–32)
Calcium: 9.6 mg/dL (ref 8.4–10.5)
Chloride: 100 mEq/L (ref 96–112)
Creatinine, Ser: 0.64 mg/dL (ref 0.40–1.20)
GFR: 94.81 mL/min (ref >60.00)
Glucose, Bld: 91 mg/dL (ref 70–99)
Potassium: 4.2 mEq/L (ref 3.5–5.1)
Sodium: 136 mEq/L (ref 135–145)

## 2020-02-06 ENCOUNTER — Ambulatory Visit: Payer: BLUE CROSS/BLUE SHIELD | Attending: Internal Medicine

## 2020-02-06 DIAGNOSIS — Z23 Encounter for immunization: Secondary | ICD-10-CM

## 2020-02-06 NOTE — Progress Notes (Signed)
   Covid-19 Vaccination Clinic  Name:  Allison Deshotels    MRN: [id number] DOB: 1961-07-28  02/06/2020  Ms. Nela Bascom was observed post Covid-19 immunization for 15 minutes without incident. She was provided with Vaccine Information Sheet and instruction to access the V-Safe system.   Ms. Sahej Hauswirth was instructed to call 911 with any severe reactions post vaccine: Marland Kitchen Difficulty breathing  . Swelling of face and throat  . A fast heartbeat  . A bad rash all over body  . Dizziness and weakness   Immunizations Administered    Name Date Dose VIS Date Route   Pfizer COVID-19 Vaccine 02/06/2020  3:37 PM 0.3 mL 10/30/2019 Intramuscular   Manufacturer: [employer], [employer]   Lot: [id number]   NDC: [address]-5

## 2020-02-27 ENCOUNTER — Ambulatory Visit: Payer: BLUE CROSS/BLUE SHIELD | Attending: Internal Medicine

## 2020-02-27 DIAGNOSIS — Z23 Encounter for immunization: Secondary | ICD-10-CM

## 2020-02-27 NOTE — Progress Notes (Signed)
   Covid-19 Vaccination Clinic  Name:  Paige Caldwell    MRN: [id number] DOB: 1961-03-03  02/27/2020  Ms. Paige Caldwell was observed post Covid-19 immunization for 15 minutes without incident. She was provided with Vaccine Information Sheet and instruction to access the V-Safe system.   Ms. Paige Caldwell was instructed to call 911 with any severe reactions post vaccine: Marland Kitchen Difficulty breathing  . Swelling of face and throat  . A fast heartbeat  . A bad rash all over body  . Dizziness and weakness   Immunizations Administered    Name Date Dose VIS Date Route   Pfizer COVID-19 Vaccine 02/27/2020  3:14 PM 0.3 mL 10/30/2019 Intramuscular   Manufacturer: [employer], [employer]   Lot: [id number]   NDC: [address]-1

## 2020-03-17 DIAGNOSIS — Z20828 Contact with and (suspected) exposure to other viral communicable diseases: Secondary | ICD-10-CM | POA: Diagnosis not present

## 2020-03-17 DIAGNOSIS — Z03818 Encounter for observation for suspected exposure to other biological agents ruled out: Secondary | ICD-10-CM | POA: Diagnosis not present

## 2020-03-19 DIAGNOSIS — B349 Viral infection, unspecified: Secondary | ICD-10-CM | POA: Diagnosis not present

## 2020-03-19 DIAGNOSIS — R05 Cough: Secondary | ICD-10-CM | POA: Diagnosis not present

## 2020-03-23 ENCOUNTER — Ambulatory Visit: Payer: Self-pay | Admitting: Family Medicine

## 2020-04-19 DIAGNOSIS — H5203 Hypermetropia, bilateral: Secondary | ICD-10-CM | POA: Diagnosis not present

## 2020-07-24 DIAGNOSIS — Z20822 Contact with and (suspected) exposure to covid-19: Secondary | ICD-10-CM | POA: Diagnosis not present

## 2020-07-24 DIAGNOSIS — Z03818 Encounter for observation for suspected exposure to other biological agents ruled out: Secondary | ICD-10-CM | POA: Diagnosis not present

## 2020-08-22 ENCOUNTER — Other Ambulatory Visit: Payer: Self-pay | Admitting: Family Medicine

## 2020-08-22 DIAGNOSIS — Z1231 Encounter for screening mammogram for malignant neoplasm of breast: Secondary | ICD-10-CM

## 2020-09-13 ENCOUNTER — Other Ambulatory Visit: Payer: Self-pay

## 2020-09-13 ENCOUNTER — Ambulatory Visit
Admission: RE | Admit: 2020-09-13 | Discharge: 2020-09-13 | Disposition: A | Discharge status: Home / Self Care | Payer: BLUE CROSS/BLUE SHIELD | Source: Ambulatory Visit | Attending: Family Medicine | Admitting: Family Medicine

## 2020-09-13 DIAGNOSIS — Z1231 Encounter for screening mammogram for malignant neoplasm of breast: Secondary | ICD-10-CM | POA: Diagnosis not present

## 2021-09-03 ENCOUNTER — Encounter: Payer: Self-pay | Admitting: Primary Care

## 2021-09-13 ENCOUNTER — Other Ambulatory Visit: Payer: Self-pay | Admitting: Family Medicine

## 2021-09-13 DIAGNOSIS — Z1231 Encounter for screening mammogram for malignant neoplasm of breast: Secondary | ICD-10-CM

## 2021-10-16 ENCOUNTER — Other Ambulatory Visit: Payer: Self-pay | Admitting: Family Medicine

## 2021-10-16 DIAGNOSIS — E559 Vitamin D deficiency, unspecified: Secondary | ICD-10-CM

## 2021-10-16 DIAGNOSIS — E785 Hyperlipidemia, unspecified: Secondary | ICD-10-CM

## 2021-10-16 DIAGNOSIS — E118 Type 2 diabetes mellitus with unspecified complications: Secondary | ICD-10-CM

## 2021-10-18 ENCOUNTER — Other Ambulatory Visit: Payer: BLUE CROSS/BLUE SHIELD

## 2021-10-18 ENCOUNTER — Ambulatory Visit
Admission: RE | Admit: 2021-10-18 | Discharge: 2021-10-18 | Disposition: A | Discharge status: Home / Self Care | Payer: BLUE CROSS/BLUE SHIELD | Source: Ambulatory Visit | Attending: Family Medicine | Admitting: Family Medicine

## 2021-10-18 DIAGNOSIS — Z1231 Encounter for screening mammogram for malignant neoplasm of breast: Secondary | ICD-10-CM | POA: Diagnosis not present

## 2021-10-27 ENCOUNTER — Encounter: Payer: Self-pay | Admitting: Family Medicine

## 2021-10-27 ENCOUNTER — Ambulatory Visit: Payer: BLUE CROSS/BLUE SHIELD | Admitting: Family Medicine

## 2021-10-27 ENCOUNTER — Other Ambulatory Visit: Payer: Self-pay

## 2021-10-27 VITALS — BP 152/92 | HR 89 | Temp 97.7°F | Ht 62.75 in | Wt 168.0 lb

## 2021-10-27 DIAGNOSIS — E669 Obesity, unspecified: Secondary | ICD-10-CM

## 2021-10-27 DIAGNOSIS — Z1211 Encounter for screening for malignant neoplasm of colon: Secondary | ICD-10-CM

## 2021-10-27 DIAGNOSIS — K760 Fatty (change of) liver, not elsewhere classified: Secondary | ICD-10-CM

## 2021-10-27 DIAGNOSIS — E559 Vitamin D deficiency, unspecified: Secondary | ICD-10-CM

## 2021-10-27 DIAGNOSIS — E785 Hyperlipidemia, unspecified: Secondary | ICD-10-CM | POA: Diagnosis not present

## 2021-10-27 DIAGNOSIS — E1159 Type 2 diabetes mellitus with other circulatory complications: Secondary | ICD-10-CM

## 2021-10-27 DIAGNOSIS — E1169 Type 2 diabetes mellitus with other specified complication: Secondary | ICD-10-CM

## 2021-10-27 DIAGNOSIS — I152 Hypertension secondary to endocrine disorders: Secondary | ICD-10-CM

## 2021-10-27 DIAGNOSIS — Z23 Encounter for immunization: Secondary | ICD-10-CM | POA: Diagnosis not present

## 2021-10-27 DIAGNOSIS — Z0001 Encounter for general adult medical examination with abnormal findings: Secondary | ICD-10-CM | POA: Diagnosis not present

## 2021-10-27 DIAGNOSIS — F331 Major depressive disorder, recurrent, moderate: Secondary | ICD-10-CM

## 2021-10-27 MED ORDER — AMLODIPINE BESYLATE 5 MG PO TABS: 5.0000 mg | ORAL_TABLET | Freq: Every day | ORAL | 3 refills | Status: AC

## 2021-10-27 MED ORDER — DULOXETINE HCL 20 MG PO CPEP: 20.0000 mg | ORAL_CAPSULE | Freq: Every day | ORAL | 3 refills | Status: DC

## 2021-10-27 MED ORDER — VITAMIN D3 25 MCG (1000 UT) PO CAPS: 1.0000 | ORAL_CAPSULE | Freq: Every day | ORAL | Status: AC

## 2021-10-27 MED ORDER — ROSUVASTATIN CALCIUM 5 MG PO TABS
5.0000 mg | ORAL_TABLET | Freq: Every day | ORAL | 3 refills | Status: DC
Start: 2021-10-27 — End: 2022-01-24

## 2021-10-27 MED ORDER — METFORMIN HCL 500 MG PO TABS: 500.0000 mg | ORAL_TABLET | Freq: Two times a day (BID) | ORAL | 3 refills | Status: AC

## 2021-10-27 NOTE — Assessment & Plan Note (Addendum)
Has been off statin for months. Agrees to restart rosuvastatin. Other statin intolerance (simva, atorvastatin). If trouble with crestor, consider livalo vs SGLT2i.  The 10-year ASCVD risk score (Arnett DK, et al., 2019) is: 17.8%   Values used to calculate the score:     Age: 60 years     Sex: Female     Is Non-Hispanic African American: No     Diabetic: Yes     Tobacco smoker: No     Systolic Blood Pressure: 152 mmHg     Is BP treated: Yes     HDL Cholesterol: 54.2 mg/dL     Total Cholesterol: 318 mg/dL

## 2021-10-27 NOTE — Assessment & Plan Note (Signed)
Encourage healthy diet and lifestyle.  

## 2021-10-27 NOTE — Assessment & Plan Note (Addendum)
Chronic, deteriorated off metformin for the past 6 months. See below. Reviewed reasons to treat diabetes, encouraged she schedule eye exam as overdue Restart metformin and reassess control at 3 mo f/u. Prior A1c at 7.5% (2020).  POC A1c was ordered but not completed. Unable to add A1c to blood in lab. Will have her return for this.

## 2021-10-27 NOTE — Assessment & Plan Note (Addendum)
Update LFT's 

## 2021-10-27 NOTE — Assessment & Plan Note (Signed)
Update levels on 1000 IU daily replacement.  

## 2021-10-27 NOTE — Patient Instructions (Addendum)
Shingrix vaccine today. Regresar en 2-6 meses para proxima vacuna.  Laboratorios hoy Complete examen de heces para revisar cancer de colon - puede recoger tarjeta en el laboratorio hoy.  Haga cita para examen de vision en [facility].  Comienze cymbalta 20mg  dairios para dolor y depresion.  Comienze amlodipine para presion arterial. Comienze de nuevo rosuvastatina y metformina para colesterol y .  Regresar en 3 meses para segiumiento diabetico.   Mantenimiento de Chief Financial Officer en las Radiographer, therapeutic, Female Adoptar un estilo de vida saludable y recibir atencin preventiva son importantes para promover la salud y [facility]. Consulte al mdico sobre: El esquema adecuado para hacerse pruebas y exmenes peridicos. Cosas que puede hacer por su cuenta para prevenir enfermedades y [place] sano. Qu debo saber sobre la dieta, el peso y el ejercicio? Consuma una dieta saludable  Consuma una dieta que incluya muchas verduras, frutas, productos lcteos con bajo contenido de [place] y [place] No consuma muchos alimentos ricos en grasas slidas, azcares agregados o sodio. Mantenga un peso saludable El ndice de masa muscular [facility]) se [facility] para identificar problemas de [place]. Proporciona una estimacin de la grasa corporal basndose en el peso y la altura. Su mdico puede ayudarle a [facility] [place] y a Engineer, site o Personnel officer un peso saludable. Haga ejercicio con regularidad Haga ejercicio con regularidad. Esta es una de las prcticas ms importantes que puede hacer por su salud. La Pharmacologist de los adultos deben seguir estas pautas: [employer], al menos, 150 minutos de actividad fsica por semana. El ejercicio debe aumentar la frecuencia cardaca y Education officer, environmental transpirar (ejercicio de intensidad moderada). Hacer ejercicios de fortalecimiento por lo Media planner por semana. Agregue esto a su plan de ejercicio de intensidad moderada. Pase menos tiempo sentada. Incluso la actividad  fsica ligera puede ser beneficiosa. Controle sus niveles de colesterol y lpidos en la sangre Comience a realizarse anlisis de lpidos y [facility] en la sangre a los 20 aos y luego reptalos cada 5 aos. Hgase controlar los niveles de colesterol con mayor frecuencia si: Sus niveles de lpidos y colesterol son altos. Es mayor de 40 aos. Presenta un alto riesgo de padecer enfermedades cardacas. Qu debo saber sobre las pruebas de deteccin del cncer? Segn su historia clnica y sus antecedentes familiares, es posible que deba realizarse pruebas de deteccin del cncer en diferentes edades. Esto puede incluir pruebas de deteccin de lo siguiente: Cncer de mama. Cncer de cuello uterino. Cncer colorrectal. Cncer de piel. Cncer de pulmn. Qu debo saber sobre la enfermedad cardaca, la diabetes y la hipertensin arterial? Presin arterial y enfermedad cardaca La hipertensin arterial causa enfermedades cardacas y Oncologist el riesgo de accidente cerebrovascular. Es ms probable que esto se manifieste en las personas que tienen lecturas de presin arterial alta o tienen sobrepeso. Hgase controlar la presin arterial: Cada 3 a 5 aos si tiene entre 18 y 18 aos. Todos los aos si es mayor de 40 aos. Diabetes Realcese exmenes de deteccin de la diabetes con regularidad. Este anlisis revisa el nivel de azcar en la sangre en [place]. Hgase las pruebas de deteccin: Cada tres aos despus de los 40 aos de edad si tiene un peso normal y un bajo riesgo de padecer diabetes. Con ms frecuencia y a partir de [place] edad inferior si tiene sobrepeso o un alto riesgo de padecer diabetes. Qu debo saber sobre la prevencin de infecciones? Hepatitis B Si tiene un riesgo ms alto de contraer hepatitis B, debe someterse a Fortuna de  deteccin de este virus. Hable con el mdico para averiguar si tiene riesgo de contraer la infeccin por hepatitis B. Hepatitis C Se recomienda el anlisis  a: [employer] 1945 y 1965. Todas las personas que tengan un riesgo de haber contrado hepatitis C. Enfermedades de transmisin sexual (ETS) Hgase las pruebas de Airline pilot de ITS, incluidas la gonorrea y la clamidia, si: Es sexualmente activa y es menor de [address]. Es mayor de [address], y Public affairs consultant informa que corre riesgo de tener este tipo de infecciones. La actividad sexual ha cambiado desde que le hicieron la ltima prueba de deteccin y tiene un riesgo mayor de Warehouse manager clamidia o Copy. Pregntele al mdico si usted tiene riesgo. Pregntele al mdico si usted tiene un alto riesgo de Primary school teacher VIH. El mdico tambin puede recomendarle un medicamento recetado para ayudar a evitar la infeccin por el VIH. Si elige tomar medicamentos para prevenir el VIH, primero debe [employer] de deteccin del VIH. Luego debe hacerse anlisis cada 3 meses mientras est tomando los medicamentos. Embarazo Si est por dejar de Armed forces training and education officer (fase premenopusica) y usted puede quedar [place], busque asesoramiento antes de [place]. [place] de 400 a 800 microgramos (mcg) de cido [employer] si [place]. Pida mtodos de control de la natalidad (anticonceptivos) si desea evitar un embarazo no deseado. Osteoporosis y [place] La osteoporosis es una enfermedad en la que los huesos pierden los minerales y la fuerza por el avance de la edad. El resultado pueden ser fracturas en los [place]. Si tiene 65 aos o ms, o si est en riesgo de sufrir osteoporosis y fracturas, pregunte a su mdico si debe: Hacerse pruebas de deteccin de prdida sea. Tomar un suplemento de calcio o de vitamina D para reducir el riesgo de fracturas. Recibir terapia de reemplazo hormonal (TRH) para tratar los sntomas de la menopausia. Siga estas indicaciones en su casa: Consumo de alcohol No beba alcohol si: Su mdico le indica no hacerlo. Est embarazada, puede estar embarazada o est  tratando de [place]. Si bebe alcohol: Limite la cantidad que bebe a lo siguiente: De 0 a 1 bebida por da. Sepa cunta cantidad de alcohol hay en las bebidas que toma. En los [address], una medida equivale a una botella de cerveza de 12 oz (355 ml), un vaso de vino de 5 oz (148 ml) o un vaso de una bebida alcohlica de alta graduacin de 1 oz (44 ml). Estilo de vida No consuma ningn producto que contenga nicotina o tabaco. Estos productos incluyen cigarrillos, tabaco para Theatre manager y aparatos de vapeo, como los Administrator, Civil Service. Si necesita ayuda para dejar de consumir estos productos, consulte al mdico. No consuma drogas. No comparta agujas. Solicite ayuda a su mdico si necesita apoyo o informacin para abandonar las drogas. Indicaciones generales Realcese los estudios de rutina de [address], dentales y de Wellsite geologist. Mantngase al da con las vacunas. Infrmele a su mdico si: Se siente deprimida con frecuencia. Alguna vez ha sido vctima de [place] o no se siente seguro en su casa. Resumen Adoptar un estilo de vida saludable y recibir atencin preventiva son importantes para promover la salud y Counsellor. Siga las instrucciones del mdico acerca de una dieta saludable, el ejercicio y la realizacin de pruebas o exmenes para Hotel manager. Siga las instrucciones del mdico con respecto al control del colesterol y la presin arterial. Esta informacin no tiene Theme park manager el consejo del mdico. Manufacturing engineer  de hacerle al mdico cualquier pregunta que tenga. Document Revised: 04/13/2021 Document Reviewed: 04/13/2021 Elsevier Patient Education  2022 [employer].

## 2021-10-27 NOTE — Assessment & Plan Note (Signed)
High PHQ9 and GAD7 scores. No active SI/HI.  Endorses longstanding history of depression since husband's passing 2010. Discussed grief counseling option - declines. Recommend start medication - will choose cymbalta given endrorses ongoing arthralgias. Monitor for worsening suicidal thoughts with commencement of antidepressant. Reassess at 3 mo f/u visit, update [place] sooner via mychart if any difficulty tolerating medication

## 2021-10-27 NOTE — Assessment & Plan Note (Signed)
Preventative protocols reviewed and updated unless pt declined. Discussed healthy diet and lifestyle.  
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
## 2021-10-27 NOTE — Assessment & Plan Note (Signed)
Chronic, uncontrolled off ACEI.  Start amlodipine 5mg  daily.  Reassess at 3 mo f/u visit

## 2021-10-28 LAB — COMPREHENSIVE METABOLIC PANEL
AG Ratio: 1.2 (calc) (ref 1.0–2.5)
ALT: 22 U/L (ref 6–29)
AST: 19 U/L (ref 10–35)
Albumin: 4.3 g/dL (ref 3.6–5.1)
Alkaline phosphatase (APISO): 108 U/L (ref 37–153)
BUN: 12 mg/dL (ref 7–25)
CO2: 25 mmol/L (ref 20–32)
Calcium: 10 mg/dL (ref 8.6–10.4)
Chloride: 99 mmol/L (ref 98–110)
Creat: 0.6 mg/dL (ref 0.50–1.05)
Globulin: 3.7 g/dL (calc) (ref 1.9–3.7)
Glucose, Bld: 132 mg/dL — ABNORMAL HIGH (ref 65–99)
Potassium: 4.2 mmol/L (ref 3.5–5.3)
Sodium: 136 mmol/L (ref 135–146)
Total Bilirubin: 0.8 mg/dL (ref 0.2–1.2)
Total Protein: 8 g/dL (ref 6.1–8.1)

## 2021-10-28 LAB — VITAMIN D 25 HYDROXY (VIT D DEFICIENCY, FRACTURES): Vit D, 25-Hydroxy: 30 ng/mL (ref 30–100)

## 2021-10-28 LAB — LIPID PANEL
Cholesterol: 326 mg/dL — ABNORMAL HIGH (ref <200)
HDL: 58 mg/dL (ref >=50)
LDL Cholesterol (Calc): 209 mg/dL (calc) — ABNORMAL HIGH
Non-HDL Cholesterol (Calc): 268 mg/dL (calc) — ABNORMAL HIGH (ref <130)
Total CHOL/HDL Ratio: 5.6 (calc) — ABNORMAL HIGH (ref <5.0)
Triglycerides: 346 mg/dL — ABNORMAL HIGH (ref <150)

## 2021-10-30 ENCOUNTER — Telehealth: Payer: Self-pay

## 2021-10-30 ENCOUNTER — Other Ambulatory Visit: Payer: Self-pay | Admitting: Family Medicine

## 2021-10-30 DIAGNOSIS — E1169 Type 2 diabetes mellitus with other specified complication: Secondary | ICD-10-CM

## 2021-10-30 NOTE — Telephone Encounter (Signed)
Lvm asking pt to call back.  Per Dr. Reece Agar, pt needs NV [@ BS] for POC A1c. (See Lab Results Notes, 10/27/21)

## 2021-10-31 NOTE — Telephone Encounter (Signed)
Pt scheduled for NV on 11/16/21 (@ BS) at 10:45.

## 2021-11-02 ENCOUNTER — Ambulatory Visit: Payer: BLUE CROSS/BLUE SHIELD

## 2021-11-16 ENCOUNTER — Ambulatory Visit: Payer: BLUE CROSS/BLUE SHIELD

## 2021-11-19 ENCOUNTER — Other Ambulatory Visit: Payer: Self-pay | Admitting: Family Medicine

## 2021-11-21 NOTE — Telephone Encounter (Signed)
Request received to change to 90 day supply from 30. Ok to do?

## 2021-11-22 NOTE — Telephone Encounter (Signed)
ERx 

## 2022-01-23 ENCOUNTER — Other Ambulatory Visit: Payer: Self-pay | Admitting: Family Medicine

## 2022-01-23 NOTE — Telephone Encounter (Signed)
Refill request Crestor ?Last refill 10/27/21 ?Last office visit 10/27/21 ?See allergy/contraindication ?

## 2022-01-26 ENCOUNTER — Ambulatory Visit (INDEPENDENT_AMBULATORY_CARE_PROVIDER_SITE_OTHER): Payer: BLUE CROSS/BLUE SHIELD | Admitting: Family Medicine

## 2022-01-26 ENCOUNTER — Other Ambulatory Visit: Payer: Self-pay

## 2022-01-26 ENCOUNTER — Encounter: Payer: Self-pay | Admitting: Family Medicine

## 2022-01-26 VITALS — BP 140/84 | HR 83 | Temp 97.3°F | Ht 62.75 in | Wt 176.0 lb

## 2022-01-26 DIAGNOSIS — R252 Cramp and spasm: Secondary | ICD-10-CM | POA: Insufficient documentation

## 2022-01-26 DIAGNOSIS — F331 Major depressive disorder, recurrent, moderate: Secondary | ICD-10-CM

## 2022-01-26 DIAGNOSIS — I152 Hypertension secondary to endocrine disorders: Secondary | ICD-10-CM

## 2022-01-26 DIAGNOSIS — E1159 Type 2 diabetes mellitus with other circulatory complications: Secondary | ICD-10-CM

## 2022-01-26 DIAGNOSIS — E785 Hyperlipidemia, unspecified: Secondary | ICD-10-CM

## 2022-01-26 DIAGNOSIS — E1169 Type 2 diabetes mellitus with other specified complication: Secondary | ICD-10-CM | POA: Diagnosis not present

## 2022-01-26 DIAGNOSIS — E669 Obesity, unspecified: Secondary | ICD-10-CM | POA: Diagnosis not present

## 2022-01-26 LAB — BASIC METABOLIC PANEL
BUN: 12 mg/dL (ref 7–25)
CO2: 29 mmol/L (ref 20–32)
Calcium: 10.6 mg/dL — ABNORMAL HIGH (ref 8.6–10.4)
Chloride: 99 mmol/L (ref 98–110)
Creat: 0.61 mg/dL (ref 0.50–1.05)
Glucose, Bld: 148 mg/dL — ABNORMAL HIGH (ref 65–99)
Potassium: 4.5 mmol/L (ref 3.5–5.3)
Sodium: 137 mmol/L (ref 135–146)

## 2022-01-26 LAB — POCT GLYCOSYLATED HEMOGLOBIN (HGB A1C): Hemoglobin A1C: 8.8 % — AB (ref 4.0–5.6)

## 2022-01-26 LAB — LIPID PANEL
Cholesterol: 321 mg/dL — ABNORMAL HIGH (ref <200)
HDL: 63 mg/dL (ref >=50)
Non-HDL Cholesterol (Calc): 258 mg/dL (calc) — ABNORMAL HIGH (ref <130)
Total CHOL/HDL Ratio: 5.1 (calc) — ABNORMAL HIGH (ref <5.0)
Triglycerides: 457 mg/dL — ABNORMAL HIGH (ref <150)

## 2022-01-26 LAB — CK: Total CK: 74 U/L (ref 29–143)

## 2022-01-26 NOTE — Assessment & Plan Note (Addendum)
Again, deteriorated control despite restarting metformin 500mg  bid which is strange. Will check fructosamine level. Continue metformin 500mg  BID. Briefly discussed GLP1RA with it's possible benefits - she's hesitant for this medication family. I did ask her to renew efforts towards diabetic diet, RTC 3 mo DM f/u visit.  ?Overdue for eye exam - encouraged she call and schedule  ?

## 2022-01-26 NOTE — Assessment & Plan Note (Addendum)
Significant improvement noted since starting cymbalta - however she does note worsening leg cramping in the last few months, ?med related. Will check CPK today (r/o statin myopathy) and if normal, consider trial off cymbalta.  ?She will likely regardless benefit from being on an antidepressant.  ?

## 2022-01-26 NOTE — Assessment & Plan Note (Signed)
Weight gain noted.  

## 2022-01-26 NOTE — Assessment & Plan Note (Addendum)
Chronic, adequate on amlodipine 5mg  daily - continue.  ?H/o creatinine elevation with ACEI 2019.  ?

## 2022-01-26 NOTE — Progress Notes (Signed)
? ? Patient ID: Paige Caldwell, female    DOB: 09-17-1961, 61 y.o.   MRN: [id number] ? ?This visit was conducted in person. ? ?BP 140/84   Pulse 83   Temp (!) 97.3 ?F (36.3 ?C) (Temporal)   Ht 5' 2.75" (1.594 m)   Wt 176 lb (79.8 kg)   SpO2 97%   BMI 31.43 kg/m?   ? ?CC: DM f/u visit  ?Subjective:  ? ?HPI: ?Paige Caldwell is a 61 y.o. female presenting on 01/26/2022 for Diabetes (Here for 3 mo f/u.) ? ? ?See prior note for details.  ?Last seen 10/2021 - at that time had stopped all meds for about 6 months - somewhat attributed ongoing depression since husband passed 2010, anxiety. At that visit I encouraged her to restart all meds and also started her on cymbalta 20mg  daily - overall she's been feeling better, less heaviness feeling and improvement in depressed mood.  ? ?Staying very stressed at work and is considering changing jobs.  ? ?She does note she's started having more daytime and nocturnal leg cramps - possibly since starting cymbalta or crestor.  ? ?DM - does regularly check sugars daily 3pm 3 hours postprandial - 120-140. Compliant with antihyperglycemic regimen which includes: metformin 500mg  bid. Denies low sugars or hypoglycemic symptoms. Denies paresthesias, blurry vision. Last diabetic eye exam 08/2018 - DUE. Glucometer brand: Relion. Last foot exam: 11/2018 - DUE. DSME: 2018 at [facility].  ?Lab Results  ?Component Value Date  ? HGBA1C 8.8 (A) 01/26/2022  ? ?Diabetic Foot Exam - Simple   ?Simple Foot Form ?Diabetic Foot exam was performed with the following findings: Yes 01/26/2022  3:28 PM  ?Visual Inspection ?No deformities, no ulcerations, no other skin breakdown bilaterally: Yes ?Sensation Testing ?Intact to touch and monofilament testing bilaterally: Yes ?Pulse Check ?Posterior Tibialis and Dorsalis pulse intact bilaterally: Yes ?Comments ?  ? ?Lab Results  ?Component Value Date  ? MICROALBUR 0.5 10/25/2017  ?  ? ?   ? ?Relevant past medical, surgical, family and social  history reviewed and updated as indicated. Interim medical history since our last visit reviewed. ?Allergies and medications reviewed and updated. ?Outpatient Medications Prior to Visit  ?Medication Sig Dispense Refill  ? amLODipine (NORVASC) 5 MG tablet Take 1 tablet (5 mg total) by mouth daily. 90 tablet 3  ? Cholecalciferol (VITAMIN D3) 25 MCG (1000 UT) CAPS Take 1 capsule (1,000 Units total) by mouth daily. 30 capsule   ? DULoxetine (CYMBALTA) 20 MG capsule TAKE 1 CAPSULE BY MOUTH EVERY DAY 90 capsule 2  ? metFORMIN (GLUCOPHAGE) 500 MG tablet Take 1 tablet (500 mg total) by mouth 2 (two) times daily with a meal. 180 tablet 3  ? rosuvastatin (CRESTOR) 5 MG tablet TAKE 1 TABLET (5 MG TOTAL) BY MOUTH DAILY. 90 tablet 1  ? ?No facility-administered medications prior to visit.  ?  ? ?Per HPI unless specifically indicated in ROS section below ?Review of Systems ? ?Objective:  ?BP 140/84   Pulse 83   Temp (!) 97.3 ?F (36.3 ?C) (Temporal)   Ht 5' 2.75" (1.594 m)   Wt 176 lb (79.8 kg)   SpO2 97%   BMI 31.43 kg/m?   ?Wt Readings from Last 3 Encounters:  ?01/26/22 176 lb (79.8 kg)  ?10/27/21 168 lb (76.2 kg)  ?11/24/19 177 lb 14.4 oz (80.7 kg)  ?  ?  ?Physical Exam ?Vitals and nursing note reviewed.  ?Constitutional:   ?   Appearance: Normal appearance. She is  not ill-appearing.  ?Eyes:  ?   Extraocular Movements: Extraocular movements intact.  ?   Conjunctiva/sclera: Conjunctivae normal.  ?   Pupils: Pupils are equal, round, and reactive to light.  ?Cardiovascular:  ?   Rate and Rhythm: Normal rate and regular rhythm.  ?   Pulses: Normal pulses.  ?   Heart sounds: Normal heart sounds. No murmur heard. ?Pulmonary:  ?   Effort: Pulmonary effort is normal. No respiratory distress.  ?   Breath sounds: Normal breath sounds. No wheezing, rhonchi or rales.  ?Musculoskeletal:  ?   Right lower leg: No edema.  ?   Left lower leg: No edema.  ?Skin: ?   General: Skin is warm and dry.  ?   Findings: No rash.  ?Neurological:  ?    Mental Status: She is alert.  ?Psychiatric:     ?   Mood and Affect: Mood normal.     ?   Behavior: Behavior normal.  ? ?   ?Results for orders placed or performed in visit on 01/26/22  ?POCT glycosylated hemoglobin (Hb A1C)  ?Result Value Ref Range  ? Hemoglobin A1C 8.8 (A) 4.0 - 5.6 %  ? HbA1c POC (<> result, manual entry)    ? HbA1c, POC (prediabetic range)    ? HbA1c, POC (controlled diabetic range)    ? ? ?Assessment & Plan:  ?This visit occurred during the SARS-CoV-2 public health emergency.  Safety protocols were in place, including screening questions prior to the visit, additional usage of staff PPE, and extensive cleaning of exam room while observing appropriate contact time as indicated for disinfecting solutions.  ? ?Problem List Items Addressed This Visit   ? ? Hypertension associated with diabetes (HCC)  ?  Chronic, adequate on amlodipine 5mg  daily - continue.  ?H/o creatinine elevation with ACEI 2019.  ?  ?  ? Hyperlipidemia associated with type 2 diabetes mellitus (HCC)  ?  H/o marked hyperlipidemia with LDL >200. Update FLP now back on low dose crestor. H/o intolerance to simvastatin and atorvastatin. Notes worsened leg cramping - check CPK.  ?Goal LDL <100 in diabetes, ideally <70 given fmhx premature CAD (father) ?  ?  ? Relevant Orders  ? Lipid panel  ? Obesity, Class I, BMI 30-34.9  ?  Weight gain noted.  ?  ?  ? Type 2 diabetes mellitus with other specified complication (HCC) - Primary  ?  Again, deteriorated control despite restarting metformin 500mg  bid which is strange. Will check fructosamine level. Continue metformin 500mg  BID. Briefly discussed GLP1RA with it's possible benefits - she's hesitant for this medication family. I did ask her to renew efforts towards diabetic diet, RTC 3 mo DM f/u visit.  ?Overdue for eye exam - encouraged she call and schedule  ?  ?  ? Relevant Orders  ? Fructosamine  ? Basic metabolic panel  ? MDD (major depressive disorder), recurrent episode, moderate (HCC)   ?  Significant improvement noted since starting cymbalta - however she does note worsening leg cramping in the last few months, ?med related. Will check CPK today (r/o statin myopathy) and if normal, consider trial off cymbalta.  ?She will likely regardless benefit from being on an antidepressant.  ?  ?  ? Leg cramping  ?  Check CPK in statin use. ?Recent commencement of cymbalta- consider trial off this medication.  ?Await labs today.  ?  ?  ? Relevant Orders  ? CK  ?  ? ?No orders of the  defined types were placed in this encounter. ? ?Orders Placed This Encounter  ?Procedures  ? Lipid panel  ? CK  ? Fructosamine  ? Basic metabolic panel  ? ? ? ?Patient Instructions  ?Haga cita para examen diabetico de ojos.  ?Laboratorios hoy.  ?Siga medicinas iguales por ahora. Pero dependiendo de [employer] Corporationlos resultados de [place], probablemente haremos algunos cambios a sus medicamentos. ?Regresar en 3 meses para proxima visita para revision de diabetes (Ozempic or Trulicity).  ? ? ?Follow up plan: ?Return in about 3 months (around 04/28/2022) for follow up visit. ? ?Eustaquio BoydenJavier Ghina Bittinger, MD   ?

## 2022-01-26 NOTE — Assessment & Plan Note (Signed)
Check CPK in statin use. ?Recent commencement of cymbalta- consider trial off this medication.  ?Await labs today.  ?

## 2022-01-26 NOTE — Patient Instructions (Addendum)
Haga cita para examen diabetico de ojos.  ?Laboratorios hoy.  ?Siga medicinas iguales por ahora. Pero dependiendo de [employer] de [place], probablemente haremos algunos cambios a sus medicamentos. ?Regresar en 3 meses para proxima visita para revision de diabetes (Ozempic or Trulicity).  ?

## 2022-01-26 NOTE — Assessment & Plan Note (Addendum)
H/o marked hyperlipidemia with LDL >200. Update FLP now back on low dose crestor. H/o intolerance to simvastatin and atorvastatin. Notes worsened leg cramping - check CPK.  ?Goal LDL <100 in diabetes, ideally <70 given fmhx premature CAD (father) ?

## 2022-01-31 LAB — FRUCTOSAMINE: Fructosamine: 332 umol/L — ABNORMAL HIGH (ref 205–285)

## 2022-05-04 ENCOUNTER — Ambulatory Visit: Payer: BLUE CROSS/BLUE SHIELD | Admitting: Family Medicine

## 2022-08-30 ENCOUNTER — Other Ambulatory Visit: Payer: Self-pay | Admitting: Family Medicine

## 2022-08-30 DIAGNOSIS — Z1231 Encounter for screening mammogram for malignant neoplasm of breast: Secondary | ICD-10-CM

## 2022-09-15 ENCOUNTER — Telehealth: Payer: Self-pay | Admitting: Family Medicine

## 2022-09-15 ENCOUNTER — Encounter: Payer: Self-pay | Admitting: Family Medicine

## 2022-09-15 NOTE — Telephone Encounter (Signed)
Plz schedule DM f/u visit as pt due.  Or if she prefers to wait until december, schedule CPE after 10/27/2022.

## 2022-09-17 NOTE — Telephone Encounter (Signed)
Please schedule appointment as instructed. 

## 2022-09-17 NOTE — Telephone Encounter (Signed)
LVM for patient to call back and scheduled. 

## 2022-09-18 NOTE — Telephone Encounter (Signed)
Noted.  And Dr. Darnell Level sent pt a MyChart message (see 09/15/22 pt msg).

## 2022-10-17 ENCOUNTER — Emergency Department
Admission: EM | Admit: 2022-10-17 | Discharge: 2022-10-17 | Disposition: A | Discharge status: Home / Self Care | Payer: Worker's Compensation | Attending: Emergency Medicine | Admitting: Emergency Medicine

## 2022-10-17 ENCOUNTER — Emergency Department: Payer: Worker's Compensation

## 2022-10-17 ENCOUNTER — Other Ambulatory Visit: Payer: Self-pay

## 2022-10-17 DIAGNOSIS — E785 Hyperlipidemia, unspecified: Secondary | ICD-10-CM | POA: Insufficient documentation

## 2022-10-17 DIAGNOSIS — I10 Essential (primary) hypertension: Secondary | ICD-10-CM | POA: Insufficient documentation

## 2022-10-17 DIAGNOSIS — M25511 Pain in right shoulder: Secondary | ICD-10-CM | POA: Diagnosis not present

## 2022-10-17 DIAGNOSIS — Z043 Encounter for examination and observation following other accident: Secondary | ICD-10-CM | POA: Diagnosis not present

## 2022-10-17 DIAGNOSIS — S93401A Sprain of unspecified ligament of right ankle, initial encounter: Secondary | ICD-10-CM | POA: Insufficient documentation

## 2022-10-17 DIAGNOSIS — Y9301 Activity, walking, marching and hiking: Secondary | ICD-10-CM | POA: Diagnosis not present

## 2022-10-17 DIAGNOSIS — E1169 Type 2 diabetes mellitus with other specified complication: Secondary | ICD-10-CM | POA: Diagnosis not present

## 2022-10-17 DIAGNOSIS — W0110XA Fall on same level from slipping, tripping and stumbling with subsequent striking against unspecified object, initial encounter: Secondary | ICD-10-CM | POA: Diagnosis not present

## 2022-10-17 DIAGNOSIS — W19XXXA Unspecified fall, initial encounter: Secondary | ICD-10-CM

## 2022-10-17 DIAGNOSIS — Y990 Civilian activity done for income or pay: Secondary | ICD-10-CM | POA: Insufficient documentation

## 2022-10-17 DIAGNOSIS — M25571 Pain in right ankle and joints of right foot: Secondary | ICD-10-CM | POA: Diagnosis not present

## 2022-10-17 DIAGNOSIS — M25551 Pain in right hip: Secondary | ICD-10-CM | POA: Insufficient documentation

## 2022-10-17 DIAGNOSIS — S99911A Unspecified injury of right ankle, initial encounter: Secondary | ICD-10-CM | POA: Diagnosis not present

## 2022-10-17 DIAGNOSIS — S0990XA Unspecified injury of head, initial encounter: Secondary | ICD-10-CM | POA: Insufficient documentation

## 2022-10-17 MED ORDER — ACETAMINOPHEN 325 MG PO TABS
650.0000 mg | ORAL_TABLET | Freq: Once | ORAL | Status: AC
  Administered 2022-10-17: 650 mg via ORAL
  Filled 2022-10-17: qty 2

## 2022-10-17 NOTE — ED Triage Notes (Signed)
Pt here with a fall today at work, pt states this will be workers comp. Pt states she hit her head and the entire right side, pt is more sore than anything. Pt denies LOC.

## 2022-10-17 NOTE — ED Provider Notes (Signed)
[facility] Provider Note    Event Date/Time   First MD Initiated Contact with Patient 10/17/22 1509     (approximate)   History   Fall   HPI  Paige Caldwell is a 61 y.o. female who presents today for evaluation after a fall.  Patient reports that she was walking into work when she stepped in a pothole and twisted her right ankle, fell onto her right side, and hit her head.  There was no LOC.  She was able to get herself up but has had pain in her right ankle, right hip, right shoulder, and head ever since.  She denies any vision changes, vomiting, neck pain, chest pain, shortness of breath, abdominal pain.  She does not take anticoagulation.  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   Paresthesia 04/23/2016   Encounter for general adult medical examination with abnormal findings 03/23/2015   Type 2 diabetes mellitus with other specified complication (HCC) 03/23/2015   Fatty liver disease, nonalcoholic 03/23/2015   Vitamin D deficiency 03/23/2015   Hypertension associated with diabetes (HCC) 02/21/2015   Hyperlipidemia associated with type 2 diabetes mellitus (HCC) 02/21/2015   Obesity, Class I, BMI 30-34.9 02/21/2015          Physical Exam   Triage Vital Signs: ED Triage Vitals  Enc Vitals Group     BP 10/17/22 1325 (!) 187/94     Pulse Rate 10/17/22 1325 87     Resp 10/17/22 1325 18     Temp 10/17/22 1325 98.4 F (36.9 C)     Temp src --      SpO2 10/17/22 1325 98 %     Weight 10/17/22 1326 175 lb 14.8 oz (79.8 kg)     Height 10/17/22 1326 5\' 3"  (1.6 m)     Head Circumference --      Peak Flow --      Pain Score 10/17/22 1325 9      Pain Loc --      Pain Edu? --      Excl. in GC? --     Most recent vital signs: Vitals:   10/17/22 1325  BP: (!) 187/94  Pulse: 87  Resp: 18  Temp: 98.4 F (36.9 C)  SpO2: 98%    Physical Exam Vitals and nursing note reviewed.  Constitutional:      General: Awake and alert. No acute distress.    Appearance: Normal appearance. The patient is normal weight.  HENT:     Head: Normocephalic and atraumatic.     Mouth: Mucous membranes are moist.  Eyes:     General: PERRL. Normal EOMs        Right eye: No discharge.        Left eye: No discharge.     Conjunctiva/sclera: Conjunctivae normal.  Cardiovascular:     Rate and Rhythm: Normal rate and regular rhythm.     Pulses: Normal pulses.  Pulmonary:     Effort: Pulmonary effort is normal. No respiratory distress.     Breath sounds: Normal breath sounds.  Abdominal:     Abdomen is soft. There is no abdominal tenderness. No rebound or guarding. No distention. Musculoskeletal:  General: No swelling. Normal range of motion.     Cervical back: Normal range of motion and neck supple.  No midline cervical spine tenderness.  Full range of motion of neck.  Negative Spurling test.  Negative Lhermitte sign.  Normal strength and sensation in bilateral upper extremities. Normal grip strength bilaterally.  Normal intrinsic muscle function of the hand bilaterally.  Normal radial pulses bilaterally. Pelvis stable.  Normal range of motion of bilateral hips with flexion and extension against resistance.  Normal internal/external rotation against resistance.  Normal range of motion of bilateral knees without pain. Right ankle: Tenderness and swelling with faint ecchymosis over the anterior talofibular ligament and posterior to lateral malleolus, no lateral or medial malleolar tenderness or proximal fifth metacarpal tenderness. No proximal fibular tenderness. 2+ pedal pulses with brisk capillary refill. Intact distal sensation and strength  with normal ROM. Able to plantar flex and dorsiflex against resistance. Able to invert and evert against resistance. Negative  dorsiflexion external rotation test. Negative squeeze test. Negative Thompson test Right shoulder: No obvious deformity, swelling, ecchymosis, or erythema No clavicular or AC joint tenderness.  Mild anterior joint line tenderness. Able to actively and passively forward flex and abduct at shoulder fully, though pain past 90 degrees.  Negative drop arm test Negative Obriens, SLAP, empty can, and lift off tests Normal internal and external rotation against resistance Pain with Hawkins and Neers Normal ROM at elbow and wrist Normal resisted pronation and supination 2+ radial pulse Normal grip strength Normal intrinsic hand muscle function Skin:    General: Skin is warm and dry.     Capillary Refill: Capillary refill takes less than 2 seconds.     Findings: No rash.  Neurological:     Mental Status: The patient is awake and alert.  Neurological: GCS 15 alert and oriented x3 Normal speech, no expressive or receptive aphasia or dysarthria Cranial nerves II through XII intact Normal visual fields 5 out of 5 strength in all 4 extremities with intact sensation throughout No extremity drift Normal finger-to-nose testing, no limb or truncal ataxia      ED Results / Procedures / Treatments   Labs (all labs ordered are listed, but only abnormal results are displayed) Labs Reviewed - No data to display   EKG     RADIOLOGY I independently reviewed and interpreted imaging and agree with radiologists findings.     PROCEDURES:  Critical Care performed:   Procedures   MEDICATIONS ORDERED IN ED: Medications  acetaminophen (TYLENOL) tablet 650 mg (650 mg Oral Given 10/17/22 1537)     IMPRESSION / MDM / ASSESSMENT AND PLAN / ED COURSE  I reviewed the triage vital signs and the nursing notes.   Differential diagnosis includes, but is not limited to,  fracture, dislocation, sprain, contusion, intracranial hemorrhage, cervical spine injury.  Patient is awake and alert, hemodynamically stable and afebrile.  CT head and neck obtained for negative for any acute findings.  X-ray of her shoulder and ankle were also obtained given that these are her locations of pain, these were negative for any acute fracture or dislocation.  Patient does have mild swelling with tenderness over the lateral malleolus of her right ankle, I suspect that she has an ankle sprain.  Achilles tendon is intact, negative Thompson test.  She was placed in a ankle stirrup brace for extra support.  We discussed rest, ice, elevation and outpatient follow-up as needed.  Patient understands and agrees with plan.  She is able to her  with a steady gait.  She was discharged in stable condition with return precautions.   Patient's presentation is most consistent with acute complicated illness / injury requiring diagnostic workup.    FINAL CLINICAL IMPRESSION(S) / ED DIAGNOSES   Final diagnoses:  Fall, initial encounter  Injury of head, initial encounter  Sprain of right ankle, unspecified ligament, initial encounter  Acute pain of right shoulder     Rx / DC Orders   ED Discharge Orders     None        Note:  This document was prepared using Dragon voice recognition software and may include unintentional dictation errors.   Keturah Shavers 10/17/22 1815    Minna Antis, MD 10/17/22 2212

## 2022-10-17 NOTE — Discharge Instructions (Signed)
Your x-rays and CT scans were normal. Continue to take Tylenol/ibuprofen per package instructions to help with your symptoms.  You may also use the ankle splint.  You may wear during the day when you are ambulating, take it off at night.  Please return for any new, worsening, or change in symptoms or other concerns.  It was a pleasure caring for you today.

## 2022-10-17 NOTE — ED Notes (Signed)
First Nurse Note: Pt to ED via [facility] for dizziness and fall at work. Pt did hit her face. Pt is having c/o and some spots in her vision. Pt had drug screen at [facility], does not need on here.

## 2022-10-18 DIAGNOSIS — H43811 Vitreous degeneration, right eye: Secondary | ICD-10-CM | POA: Diagnosis not present

## 2022-10-19 ENCOUNTER — Ambulatory Visit
Admission: RE | Admit: 2022-10-19 | Discharge: 2022-10-19 | Disposition: A | Discharge status: Home / Self Care | Payer: BLUE CROSS/BLUE SHIELD | Source: Ambulatory Visit | Attending: Family Medicine | Admitting: Family Medicine

## 2022-10-19 DIAGNOSIS — Z1231 Encounter for screening mammogram for malignant neoplasm of breast: Secondary | ICD-10-CM | POA: Diagnosis not present

## 2022-11-17 DIAGNOSIS — H43811 Vitreous degeneration, right eye: Secondary | ICD-10-CM | POA: Diagnosis not present
